# Patient Record
Sex: Female | Born: 1980 | Race: Black or African American | Hispanic: No | Marital: Single | Smoking: Current every day smoker
Health system: Southern US, Community
[De-identification: ages and names within clinical notes are randomized; demographics above are authoritative.]

## PROBLEM LIST (undated history)

## (undated) DIAGNOSIS — D219 Benign neoplasm of connective and other soft tissue, unspecified: Secondary | ICD-10-CM

## (undated) DIAGNOSIS — D649 Anemia, unspecified: Secondary | ICD-10-CM

## (undated) HISTORY — DX: Anemia, unspecified: D64.9

## (undated) HISTORY — DX: Benign neoplasm of connective and other soft tissue, unspecified: D21.9

## (undated) HISTORY — PX: TUBAL LIGATION: SHX77

## (undated) HISTORY — PX: GALLBLADDER SURGERY: SHX652

## (undated) SURGERY — HYSTERECTOMY, SUPRACERVICAL, ABDOMINAL
Anesthesia: General

---

## 2002-07-26 ENCOUNTER — Encounter: Payer: Self-pay | Admitting: Emergency Medicine

## 2002-07-26 ENCOUNTER — Emergency Department (HOSPITAL_COMMUNITY): Admission: EM | Admit: 2002-07-26 | Discharge: 2002-07-26 | Payer: Self-pay | Admitting: Emergency Medicine

## 2012-10-11 LAB — OB RESULTS CONSOLE HGB/HCT, BLOOD
HCT: 27 %
Hemoglobin: 8.3 g/dL

## 2012-10-11 LAB — OB RESULTS CONSOLE PLATELET COUNT: Platelets: 350 10*3/uL

## 2012-10-16 ENCOUNTER — Other Ambulatory Visit (HOSPITAL_COMMUNITY): Payer: Self-pay | Admitting: *Deleted

## 2012-10-16 DIAGNOSIS — N926 Irregular menstruation, unspecified: Secondary | ICD-10-CM

## 2012-10-19 ENCOUNTER — Ambulatory Visit (HOSPITAL_COMMUNITY): Payer: Medicaid Other

## 2012-10-22 ENCOUNTER — Ambulatory Visit (HOSPITAL_COMMUNITY)
Admission: RE | Admit: 2012-10-22 | Discharge: 2012-10-22 | Disposition: A | Discharge status: Home / Self Care | Payer: Medicaid Other | Source: Ambulatory Visit | Attending: *Deleted | Admitting: *Deleted

## 2012-10-22 ENCOUNTER — Other Ambulatory Visit (HOSPITAL_COMMUNITY): Payer: Medicaid Other

## 2012-10-22 DIAGNOSIS — R9389 Abnormal findings on diagnostic imaging of other specified body structures: Secondary | ICD-10-CM | POA: Insufficient documentation

## 2012-10-22 DIAGNOSIS — N949 Unspecified condition associated with female genital organs and menstrual cycle: Secondary | ICD-10-CM | POA: Insufficient documentation

## 2012-10-22 DIAGNOSIS — N926 Irregular menstruation, unspecified: Secondary | ICD-10-CM | POA: Insufficient documentation

## 2012-11-06 ENCOUNTER — Other Ambulatory Visit (HOSPITAL_COMMUNITY): Payer: Self-pay | Admitting: *Deleted

## 2012-11-06 DIAGNOSIS — D219 Benign neoplasm of connective and other soft tissue, unspecified: Secondary | ICD-10-CM

## 2012-11-16 ENCOUNTER — Ambulatory Visit (HOSPITAL_COMMUNITY)
Admission: RE | Admit: 2012-11-16 | Discharge: 2012-11-16 | Disposition: A | Discharge status: Home / Self Care | Payer: Medicaid Other | Source: Ambulatory Visit | Attending: *Deleted | Admitting: *Deleted

## 2012-11-16 DIAGNOSIS — D219 Benign neoplasm of connective and other soft tissue, unspecified: Secondary | ICD-10-CM

## 2012-11-16 DIAGNOSIS — N83209 Unspecified ovarian cyst, unspecified side: Secondary | ICD-10-CM | POA: Insufficient documentation

## 2012-11-16 DIAGNOSIS — D250 Submucous leiomyoma of uterus: Secondary | ICD-10-CM | POA: Insufficient documentation

## 2012-11-28 ENCOUNTER — Encounter: Payer: Self-pay | Admitting: *Deleted

## 2012-11-28 ENCOUNTER — Ambulatory Visit (INDEPENDENT_AMBULATORY_CARE_PROVIDER_SITE_OTHER): Payer: Medicaid Other | Admitting: Obstetrics and Gynecology

## 2012-11-28 ENCOUNTER — Encounter: Payer: Self-pay | Admitting: Obstetrics and Gynecology

## 2012-11-28 VITALS — BP 100/68 | Wt 149.2 lb

## 2012-11-28 DIAGNOSIS — N920 Excessive and frequent menstruation with regular cycle: Secondary | ICD-10-CM

## 2012-11-28 DIAGNOSIS — R809 Proteinuria, unspecified: Secondary | ICD-10-CM

## 2012-11-28 DIAGNOSIS — N946 Dysmenorrhea, unspecified: Secondary | ICD-10-CM

## 2012-11-28 DIAGNOSIS — R319 Hematuria, unspecified: Secondary | ICD-10-CM

## 2012-11-28 DIAGNOSIS — D250 Submucous leiomyoma of uterus: Secondary | ICD-10-CM

## 2012-11-28 DIAGNOSIS — D649 Anemia, unspecified: Secondary | ICD-10-CM

## 2012-11-28 DIAGNOSIS — R350 Frequency of micturition: Secondary | ICD-10-CM

## 2012-11-28 LAB — POCT URINALYSIS DIPSTICK
Blood, UA: 3
Glucose, UA: NEGATIVE
Nitrite, UA: NEGATIVE

## 2012-11-28 MED ORDER — MEGESTROL ACETATE 40 MG PO TABS: 40.0000 mg | ORAL_TABLET | ORAL | Status: DC

## 2012-11-28 NOTE — Patient Instructions (Signed)
Fibroids Fibroids are lumps (tumors) that can occur any place in a woman's body. These lumps are not cancerous. Fibroids vary in size, weight, and where they grow. HOME CARE  Do not take aspirin.  Write down the number of pads or tampons you use during your period. Tell your doctor. This can help determine the best treatment for you. GET HELP RIGHT AWAY IF:  You have pain in your lower belly (abdomen) that is not helped with medicine.  You have cramps that are not helped with medicine.  You have more bleeding between or during your period.  You feel lightheaded or pass out (faint).  Your lower belly pain gets worse. MAKE SURE YOU:  Understand these instructions.  Will watch your condition.  Will get help right away if you are not doing well or get worse. Document Released: 09/03/2010 Document Revised: 10/24/2011 Document Reviewed: 09/03/2010 [facility] Patient Information 2013 [place], [place]. You will receive a prescription for medication, Megace, which will be called to the pharmacy in your records, [facility], and  will take this medication 3 times daily until bleeding stops, then once daily until her followup appointment. If bleeding remains uncontrolled please call us so we can reschedule at an earlier date. Continue your iron and vitamins twice a day.

## 2012-11-28 NOTE — Progress Notes (Signed)
  Subjective:    Tara Moon is a 32 y.o. female who presents with uterine fibroids. Periods are and quite heavy resulting in anemia, lasting several days. Dysmenorrhea:severe, occurring throughout menses. Cyclic symptoms include none. No intermenstrual bleeding, spotting, or discharge. Tara Moon  is referred from the health department. Ultrasound has shown her to have a submucous pedunculated fibroid. Sonohysterogram shows that the fibroid is primarily inside the endometrial cavity and would be treatable by hysteroscopic resection, or supracervical hysterectomy. After discussion, the patient desires to proceed with supracervical hysterectomy.  Current contraception: tubal ligation History of abnormal Pap smear: no Family history of uterine or ovarian cancer: no Regular self breast exam: not applicable History of abnormal mammogram: no Family history of breast cancer: no History of abnormal lipids: no Menstrual History: OB History   Grav Para Term Preterm Abortions TAB SAB Ect Mult Living   6 4 4  2  1   4         The following portions of the patient's history were reviewed and updated as appropriate: past family history, past social history and problem list.  Review of Systems Pertinent items are noted in HPI.   Objective:     BP 100/68  Wt 149 lb 3.2 oz (67.677 kg)  LMP 11/27/2012 General appearance: alert, no distress and pale Head: Normocephalic, without obvious abnormality Abdomen: soft, non-tender; bowel sounds normal; no masses,  no organomegaly Pelvic: cervix normal in appearance, external genitalia normal, no adnexal masses or tenderness, no bladder tenderness, no cervical motion tenderness, rectovaginal septum normal, vagina normal without discharge and Uterus well supported, upper limits normal size, deep in pelvis Extremities: extremities normal, atraumatic, no cyanosis or edema and Homans sign is negative, no sign of DVT Pulses: 2+ and symmetric   Assessment:    Symptomatic uterine fibroids.   Plan:   All questions answered. After discussion of treatment options, we will proceed toward laparoscopic supracervical hysterectomy. The patient will take Megace 3 times daily until the bleeding stops, then once daily until followup in 3 weeks. Records from health department to be reviewed. Preop appointment 3 weeks to recheck hemoglobin and finalize surgical plans

## 2012-12-17 ENCOUNTER — Encounter (HOSPITAL_COMMUNITY): Payer: Self-pay | Admitting: Pharmacist

## 2012-12-19 ENCOUNTER — Encounter: Payer: Self-pay | Admitting: Obstetrics and Gynecology

## 2012-12-19 ENCOUNTER — Ambulatory Visit (INDEPENDENT_AMBULATORY_CARE_PROVIDER_SITE_OTHER): Payer: Medicaid Other | Admitting: Obstetrics and Gynecology

## 2012-12-19 VITALS — BP 100/60 | Ht 67.0 in | Wt 148.8 lb

## 2012-12-19 DIAGNOSIS — D259 Leiomyoma of uterus, unspecified: Secondary | ICD-10-CM

## 2012-12-19 DIAGNOSIS — D250 Submucous leiomyoma of uterus: Secondary | ICD-10-CM

## 2012-12-19 DIAGNOSIS — D500 Iron deficiency anemia secondary to blood loss (chronic): Secondary | ICD-10-CM

## 2012-12-19 DIAGNOSIS — D649 Anemia, unspecified: Secondary | ICD-10-CM

## 2012-12-19 DIAGNOSIS — N920 Excessive and frequent menstruation with regular cycle: Secondary | ICD-10-CM

## 2012-12-19 LAB — POCT HEMOGLOBIN: Hemoglobin: 8.5 g/dL — AB (ref 12.2–16.2)

## 2012-12-19 MED ORDER — LEUPROLIDE ACETATE 3.75 MG IM KIT: 3.7500 mg | PACK | Freq: Once | INTRAMUSCULAR | Status: DC

## 2012-12-19 NOTE — Patient Instructions (Addendum)
Tara Moon  12/19/2012   Your procedure is scheduled on:   12/25/2012  Report to [facility] at  945  AM.  Call this number if you have problems the morning of surgery: [phone]   Remember:   Do not eat food or drink liquids after midnight.   Take these medicines the morning of surgery with A SIP OF WATER:  norco   Do not wear jewelry, make-up or nail polish.  Do not wear lotions, powders, or perfumes.  Do not shave 48 hours prior to surgery. Men may shave face and neck.  Do not bring valuables to the hospital.  Contacts, dentures or bridgework may not be worn into surgery.  Leave suitcase in the car. After surgery it may be brought to your room.  For patients admitted to the hospital, checkout time is 11:00 AM the day of discharge.   Patients discharged the day of surgery will not be allowed to drive  home.  Name and phone number of your driver: family  Special Instructions: Shower using CHG 2 nights before surgery and the night before surgery.  If you shower the day of surgery use CHG.  Use special wash - you have one bottle of CHG for all showers.  You should use approximately 1/3 of the bottle for each shower.   Please read over the following fact sheets that you were given: Pain Booklet, Coughing and Deep Breathing, Blood Transfusion Information, MRSA Information, Surgical Site Infection Prevention, Anesthesia Post-op Instructions and Care and Recovery After Surgery Supracervical Hysterectomy A supracervical hysterectomy is minimally invasive surgery to remove the top part of the uterus, but not the cervix. This surgery can be performed by making a large cut (incision) in the abdomen. It can also be done with a thin, lighted tube (laparoscope) inserted into 2 small incisions in the lower abdomen. Your fallopian tubes and ovaries can be removed (bilateral salpingo-oopherectomy) during this surgery as well. If a supracervical hysterectomy is started and it is not safe to continue,  the laparoscopic surgery will be converted to an open abdominal surgery. You will not have menstrual periods or be able to get pregnant after having this surgery. If a bilateral salpingo-oopherectomy was performed before menopause, you will go through a sudden (abrupt) menopause. This can be helped with hormone medicines. Benefits of minimally invasive surgery include:  Less pain.  Less risk of blood loss.  Less risk of infection.  Quicker return to normal activities.  Usually a 1 night stay in the hospital.  Overall patient satisfaction. LET YOUR CAREGIVER KNOW ABOUT:  Any history of abnormal Pap tests.  Allergies to food or medicine.  Medicines taken, including vitamins, herbs, eyedrops, over-the-counter medicines, and creams.  Use of steroids (by mouth or creams).  Previous problems with anesthetics or numbing medicines.  History of bleeding problems or blood clots.  Previous surgery.  Other health problems, including diabetes and kidney problems.  Any infections or colds you may have developed.  Symptoms of irregular or heavy periods, weight loss, or urinary or bowel changes. RISKS AND COMPLICATIONS   Bleeding.  Blood clots in the legs or lung.  Infection.  Injury to surrounding organs.  Problems with anesthesia.  Risk of conversion to an open abdominal incision.  Early menopause symptoms (hot flashes, night sweats, insomnia).  Additional surgery later to remove the cervix if you have problems with the cervix. BEFORE THE PROCEDURE  Ask your caregiver about changing or stopping your regular medicines.  Do not take aspirin or blood thinners (anticoagulants) for 1 week before the surgery, or as told by your caregiver.  Do not eat or drink anything for 8 hours before the surgery, or as told by your caregiver.  Quit smoking if you smoke.  Arrange for a ride home after surgery and for someone to help you at home during recovery. PROCEDURE   You will be  given an antibiotic medicine.  An intravenous (IV) line will be placed in your arm. You will be given medicine to make you sleep (general anesthetic).  A gas (carbon dioxide) will be used to inflate your abdomen. This will allow your surgeon to look inside your abdomen, perform your surgery, and treat any other problems found if necessary.  Three or four small incisions (often less than  inch) will be made in your abdomen. One of these incisions will be made in the area of your belly button (navel). The laparoscope will be inserted into the incision. Your surgeon will look through the laparoscope while doing your procedure.  Other surgical instruments will be inserted through the other incisions.  The uterus will be cut into small pieces and removed through the small incisions.  Your incisions will be closed. AFTER THE PROCEDURE   The gas will be released from inside your abdomen.  You will be taken to the recovery area where a nurse will watch and check your progress. Once you are awake, stable, and taking fluids well, without other problems, you will return to your room or be allowed to go home.  There is usually minimal discomfort following the surgery because the incisions are so small.  You will be given pain medicine while you are in the hospital and for when you go home.  Try to have someone with you for the first 3 to 5 days after you go home.  Follow up with your surgeon in 2 to 4 weeks after surgery to evaluate your progress. Document Released: 01/18/2008 Document Revised: 10/24/2011 Document Reviewed: 03/18/2011 [facility] Patient Information 2013 [place], [place]. PATIENT INSTRUCTIONS POST-ANESTHESIA  IMMEDIATELY FOLLOWING SURGERY:  Do not drive or operate machinery for the first twenty four hours after surgery.  Do not make any important decisions for twenty four hours after surgery or while taking narcotic pain medications or sedatives.  If you develop intractable nausea  and vomiting or a severe headache please notify your doctor immediately.  FOLLOW-UP:  Please make an appointment with your surgeon as instructed. You do not need to follow up with anesthesia unless specifically instructed to do so.  WOUND CARE INSTRUCTIONS (if applicable):  Keep a dry clean dressing on the anesthesia/puncture wound site if there is drainage.  Once the wound has quit draining you may leave it open to air.  Generally you should leave the bandage intact for twenty four hours unless there is drainage.  If the epidural site drains for more than 36-48 hours please call the anesthesia department.  QUESTIONS?:  Please feel free to call your physician or the hospital operator if you have any questions, and they will be happy to assist you.

## 2012-12-19 NOTE — Patient Instructions (Addendum)
Return tomorrow with lupron for injection to suppress the endometrium and the periods. We will reschedule the surgery for 4-6 weeks.

## 2012-12-19 NOTE — Progress Notes (Signed)
Patient has still low hgb, had 2 heavy menses this month. Options reviewed; will suppress with lupron 3.75 mg IM, and postpone surgery 6 wks.  Will rx today, get shot tomorrow . Reschedule surgery x 4-6 wks

## 2012-12-20 ENCOUNTER — Encounter (HOSPITAL_COMMUNITY)
Admission: RE | Admit: 2012-12-20 | Discharge: 2012-12-20 | Disposition: A | Discharge status: Home / Self Care | Payer: Medicaid Other | Source: Ambulatory Visit | Attending: *Deleted | Admitting: *Deleted

## 2012-12-20 ENCOUNTER — Ambulatory Visit: Payer: Medicaid Other

## 2012-12-25 ENCOUNTER — Encounter (HOSPITAL_COMMUNITY): Admission: RE | Payer: Self-pay | Source: Ambulatory Visit

## 2012-12-25 ENCOUNTER — Ambulatory Visit (HOSPITAL_COMMUNITY)
Admission: RE | Admit: 2012-12-25 | Payer: Medicaid Other | Source: Ambulatory Visit | Admitting: Obstetrics and Gynecology

## 2012-12-25 SURGERY — HYSTERECTOMY, SUPRACERVICAL, LAPAROSCOPIC
Anesthesia: General

## 2013-01-08 ENCOUNTER — Encounter: Payer: Medicaid Other | Admitting: Obstetrics and Gynecology

## 2013-01-16 ENCOUNTER — Ambulatory Visit: Payer: Medicaid Other | Admitting: Obstetrics and Gynecology

## 2013-03-11 ENCOUNTER — Other Ambulatory Visit: Payer: Self-pay | Admitting: Obstetrics and Gynecology

## 2013-03-11 ENCOUNTER — Ambulatory Visit (INDEPENDENT_AMBULATORY_CARE_PROVIDER_SITE_OTHER): Payer: Medicaid Other | Admitting: Obstetrics and Gynecology

## 2013-03-11 ENCOUNTER — Encounter: Payer: Self-pay | Admitting: Obstetrics and Gynecology

## 2013-03-11 VITALS — BP 108/74 | Ht 65.0 in | Wt 150.4 lb

## 2013-03-11 DIAGNOSIS — N949 Unspecified condition associated with female genital organs and menstrual cycle: Secondary | ICD-10-CM

## 2013-03-11 DIAGNOSIS — N938 Other specified abnormal uterine and vaginal bleeding: Secondary | ICD-10-CM

## 2013-03-11 DIAGNOSIS — D649 Anemia, unspecified: Secondary | ICD-10-CM

## 2013-03-11 MED ORDER — FERUMOXYTOL INJECTION 510 MG/17 ML: 510.0000 mg | Freq: Once | INTRAVENOUS | Status: DC

## 2013-03-11 MED ORDER — MEGESTROL ACETATE 40 MG PO TABS: 40.0000 mg | ORAL_TABLET | Freq: Three times a day (TID) | ORAL | Status: DC

## 2013-03-11 NOTE — Patient Instructions (Addendum)
Expect to call regarding the IV iron infusion at [facility] within the week if you not heard from that os by Friday please call our office to medication will be infused at 1 PM on Wednesday of this week please take this paper with you to the office on Wednesday

## 2013-03-11 NOTE — Addendum Note (Signed)
Addended by: Tilda Burrow on: 03/11/2013 02:54 PM   Modules accepted: Level of Service

## 2013-03-11 NOTE — Progress Notes (Signed)
   [facility] Visit  Patient name: Tara Moon MRN [id number]  Date of birth: 02-05-1981  CC & HPI:  Tara Moon is a 32 y.o. female presenting today for for followup of her previous visits in May where we were planning surgery for submucous fibroid. Patient desires supracervical hysterectomy. She's had a normal Pap smear at the health department earlier this year hemoglobin recheck today 7.5. Patient has been bleeding quite heavily last 2 weeks since he ran out of Megace  ROS:  Heavy clots huge clots  Pertinent History Reviewed:  Medical & Surgical Hx:  Reviewed: Significant for sonohysterogram showing intramural and submucosal fibroid Medications: Reviewed & Updated - see associated section Social History: Reviewed -  reports that she has been smoking Cigarettes.  She has a 1.5 pack-year smoking history. She has never used smokeless tobacco.  Objective Findings:  Vitals: BP 108/74  Ht 5\' 5"  (1.651 m)  Wt 68.221 kg (150 lb 6.4 oz)  BMI 25.03 kg/m2  LMP 03/02/2013  Physical Examination: General appearance - alert, well appearing, and in no distress, oriented to person, place, and time and mildly anemic, slight pale, cold intolerance Mental status - alert, oriented to person, place, and time, normal mood, behavior, speech, dress, motor activity, and thought processes   Assessment & Plan:   Recurrent dysfunctional bleeding due to submucous or pedunculated fibroid   Recurrent anemia will restart Megace, schedule IV in infusion at [facility] through outpatient appointments, and hoped to do surgery in approximately 4 weeks

## 2013-03-12 ENCOUNTER — Other Ambulatory Visit: Payer: Self-pay | Admitting: Obstetrics and Gynecology

## 2013-03-13 ENCOUNTER — Encounter (HOSPITAL_COMMUNITY)
Admission: RE | Admit: 2013-03-13 | Discharge: 2013-03-13 | Disposition: A | Discharge status: Home / Self Care | Payer: Medicaid Other | Source: Ambulatory Visit | Attending: Obstetrics and Gynecology | Admitting: Obstetrics and Gynecology

## 2013-03-13 DIAGNOSIS — D649 Anemia, unspecified: Secondary | ICD-10-CM | POA: Insufficient documentation

## 2013-03-13 MED ORDER — FERUMOXYTOL INJECTION 510 MG/17 ML
510.0000 mg | Freq: Once | INTRAVENOUS | Status: AC
  Administered 2013-03-13: 510 mg via INTRAVENOUS
  Filled 2013-03-13: qty 17

## 2013-03-13 MED ORDER — DIPHENHYDRAMINE HCL 50 MG/ML IJ SOLN
INTRAMUSCULAR | Status: AC
  Filled 2013-03-13: qty 1

## 2013-03-13 MED ORDER — DIPHENHYDRAMINE HCL 50 MG/ML IJ SOLN
25.0000 mg | Freq: Once | INTRAMUSCULAR | Status: AC
  Administered 2013-03-13: 25 mg via INTRAVENOUS

## 2013-03-13 MED ORDER — SODIUM CHLORIDE 0.9 % IV SOLN
Freq: Once | INTRAVENOUS | Status: AC
  Administered 2013-03-13: 14:00:00 via INTRAVENOUS

## 2013-03-13 NOTE — Progress Notes (Signed)
Tolerated diluted infusion well. No S/S kept 30 mins post infusion. VSS stable.

## 2013-03-13 NOTE — Progress Notes (Signed)
Dr. Emelda Fear in, Feraheme diluted in 50ml NS infused via guardrail IV. Benadryl administered previously.

## 2013-04-08 ENCOUNTER — Ambulatory Visit (INDEPENDENT_AMBULATORY_CARE_PROVIDER_SITE_OTHER): Payer: Medicaid Other | Admitting: Obstetrics and Gynecology

## 2013-04-08 ENCOUNTER — Encounter: Payer: Self-pay | Admitting: Obstetrics and Gynecology

## 2013-04-08 VITALS — BP 110/78 | Ht 65.0 in | Wt 150.8 lb

## 2013-04-08 DIAGNOSIS — Z01818 Encounter for other preprocedural examination: Secondary | ICD-10-CM

## 2013-04-08 DIAGNOSIS — D649 Anemia, unspecified: Secondary | ICD-10-CM

## 2013-04-08 LAB — POCT HEMOGLOBIN: Hemoglobin: 11.3 g/dL — AB (ref 12.2–16.2)

## 2013-04-08 NOTE — Progress Notes (Signed)
   [facility] Visit  Patient name: Tara Moon MRN [id number]  Date of birth: October 10, 1980  CC & HPI:  Tara Moon is a 32 y.o. female presenting today for rescheduling of Supracervical Hysterectomy  ROS:  Patient has had menses controlled adequately on Megace, and last menses was less severe  Pertinent History Reviewed:  Medical & Surgical Hx:  Reviewed: Significant for known submucosal fibroid Medications: Reviewed & Updated - see associated section Social History: Reviewed -  reports that she has been smoking Cigarettes.  She has a 1.5 pack-year smoking history. She has never used smokeless tobacco.  Objective Findings:  Vitals: BP 110/78  Ht 5\' 5"  (1.651 m)  Wt 150 lb 12.8 oz (68.402 kg)  BMI 25.09 kg/m2  LMP 04/04/2013  Physical Examination: General appearance - alert, well appearing, and in no distress, oriented to person, place, and time, normal appearing weight and well hydrated Mental status - alert, oriented to person, place, and time, normal mood, behavior, speech, dress, motor activity, and thought processes Neck - supple, no significant adenopathy Chest - clear to auscultation, no wheezes, rales or rhonchi, symmetric air entry Abdomen - soft, nontender, nondistended, no masses or organomegaly Pelvic - VULVA: normal appearing vulva with no masses, tenderness or lesions, VAGINA: normal appearing vagina with normal color and discharge, no lesions, CERVIX: normal appearing cervix without discharge or lesions, DNA probe for chlamydia and GC obtained, UTERUS: enlarged to 8 week's size, ADNEXA: normal adnexa in size, nontender and no masses Musculoskeletal - no muscular tenderness noted Extremities - peripheral pulses normal, no pedal edema, no clubbing or cyanosis   Assessment & Plan:   Submucosal fibroid, patient declining hysteroscopic effort at excision, desiring supracervical hysterectomy Plan: supracervical hysterectomy, open procedure to excise

## 2013-04-08 NOTE — Patient Instructions (Signed)
See Dawn to get info on preop appt time and date We will perform supracervical hysterectomy and remove both tubes. We will preserve the ovaries, and the cervix.

## 2013-04-09 LAB — GC/CHLAMYDIA PROBE AMP: GC Probe RNA: NEGATIVE

## 2013-04-13 ENCOUNTER — Telehealth: Payer: Self-pay | Admitting: Obstetrics and Gynecology

## 2013-04-13 NOTE — Progress Notes (Signed)
Note completed in progress note

## 2013-04-17 ENCOUNTER — Other Ambulatory Visit: Payer: Self-pay | Admitting: Obstetrics and Gynecology

## 2013-04-17 NOTE — H&P (Signed)
Tara Moon is an 32 y.o. female. 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 She has had discussion of treatment options, and desires hysterectomy, planned as a supracervical hysterectomy.   Pertinent Gynecological History: Menses: flow is excessive with use of numerous pads or tampons on heaviest days Bleeding: dysfunctional uterine bleeding Contraception: none DES exposure: unknown Blood transfusions: none Sexually transmitted diseases: no past history Previous GYN Procedures:   Last mammogram:  Date:  Last pap: normal Date:  OB History: G6, P4024   Menstrual History: Menarche age:  Patient's last menstrual period was 04/04/2013.    Past Medical History  Diagnosis Date  . Anemia   . Fibroids     Past Surgical History  Procedure Laterality Date  . Tubal ligation    . Gallbladder surgery      No family history on file.  Social History:  reports that she has been smoking Cigarettes.  She has a 1.5 pack-year smoking history. She has never used smokeless tobacco. She reports that  drinks alcohol. She reports that she uses illicit drugs (Marijuana).  Allergies:  Allergies  Allergen Reactions  . Aspirin Rash     (Not in a hospital admission)  ROS  Last menstrual period 04/04/2013. Physical Exam Physical Examination: General appearance - alert, well appearing, and in no distress, oriented to person, place, and time and normal appearing weight Mental status - alert, oriented to person, place, and time Neck - supple, no significant adenopathy Heart - normal rate and regular rhythm Abdomen - soft, nontender, nondistended, no masses or organomegaly Pelvic - normal external genitalia, vulva, vagina, cervix, uterus and adnexa, UTERUS: uterus is normal size, shape, consistency and nontender, enlarged to 8 week's size See u/s report for sonohysterogram No results found for this or any previous visit (from the past 24  hour(s)).  No results found.  Assessment/Plan: Submucus fibroid, and resultant anemia, corrected., admitted for supracervical hysterectomy  Tamisha Nordstrom V 04/17/2013, 6:03 PM  

## 2013-04-22 ENCOUNTER — Encounter (HOSPITAL_COMMUNITY): Payer: Self-pay | Admitting: Pharmacy Technician

## 2013-04-25 ENCOUNTER — Encounter (HOSPITAL_COMMUNITY): Payer: Self-pay

## 2013-04-25 ENCOUNTER — Encounter (HOSPITAL_COMMUNITY)
Admission: RE | Admit: 2013-04-25 | Discharge: 2013-04-25 | Disposition: A | Discharge status: Home / Self Care | Payer: Medicaid Other | Source: Ambulatory Visit | Attending: Obstetrics and Gynecology | Admitting: Obstetrics and Gynecology

## 2013-04-25 DIAGNOSIS — Z01818 Encounter for other preprocedural examination: Secondary | ICD-10-CM | POA: Insufficient documentation

## 2013-04-25 DIAGNOSIS — Z01812 Encounter for preprocedural laboratory examination: Secondary | ICD-10-CM | POA: Insufficient documentation

## 2013-04-25 LAB — URINALYSIS, ROUTINE W REFLEX MICROSCOPIC
Protein, ur: NEGATIVE mg/dL
Urobilinogen, UA: 0.2 mg/dL (ref 0.0–1.0)

## 2013-04-25 LAB — BASIC METABOLIC PANEL
Chloride: 108 mEq/L (ref 96–112)
GFR calc Af Amer: >90 mL/min (ref >90)
Potassium: 3.8 mEq/L (ref 3.5–5.1)

## 2013-04-25 LAB — URINE MICROSCOPIC-ADD ON

## 2013-04-25 LAB — CBC
HCT: 35.2 % — ABNORMAL LOW (ref 36.0–46.0)
Hemoglobin: 11.5 g/dL — ABNORMAL LOW (ref 12.0–15.0)
WBC: 7.4 10*3/uL (ref 4.0–10.5)

## 2013-04-25 NOTE — Patient Instructions (Addendum)
Your procedure is scheduled on: 04/30/2013  Report to [facility] at  6:15   AM.  Call this number if you have problems the morning of surgery: [phone]   Remember:   Do not drink or eat food:After Midnight.  :  Take these medicines the morning of surgery with A SIP OF WATER:    Do not wear jewelry, make-up or nail polish.  Do not wear lotions, powders, or perfumes. You may wear deodorant.  Do not shave 48 hours prior to surgery. Men may shave face and neck.  Do not bring valuables to the hospital.  Contacts, dentures or bridgework may not be worn into surgery.  Leave suitcase in the car. After surgery it may be brought to your room.  For patients admitted to the hospital, checkout time is 11:00 AM the day of discharge.   Patients discharged the day of surgery will not be allowed to drive home.    Special Instructions: Shower using CHG 2 nights before surgery and the night before surgery.  If you shower the day of surgery use CHG.  Use special wash - you have one bottle of CHG for all showers.  You should use approximately 1/3 of the bottle for each shower.   Please read over the following fact sheets that you were given: Pain Booklet, MRSA Information, Surgical Site Infection Prevention and Care and Recovery After Surgery  Supracervical Hysterectomy A supracervical hysterectomy is minimally invasive surgery to remove the top part of the uterus, but not the cervix. This surgery can be performed by making a large cut (incision) in the abdomen. It can also be done with a thin, lighted tube (laparoscope) inserted into 2 small incisions in the lower abdomen. Your fallopian tubes and ovaries can be removed (bilateral salpingo-oopherectomy) during this surgery as well. If a supracervical hysterectomy is started and it is not safe to continue, the laparoscopic surgery will be converted to an open abdominal surgery. You will not have menstrual periods or be able to get pregnant after having this  surgery. If a bilateral salpingo-oopherectomy was performed before menopause, you will go through a sudden (abrupt) menopause. This can be helped with hormone medicines. Benefits of minimally invasive surgery include:  Less pain.  Less risk of blood loss.  Less risk of infection.  Quicker return to normal activities.  Usually a 1 night stay in the hospital.  Overall patient satisfaction. LET YOUR CAREGIVER KNOW ABOUT:  Any history of abnormal Pap tests.  Allergies to food or medicine.  Medicines taken, including vitamins, herbs, eyedrops, over-the-counter medicines, and creams.  Use of steroids (by mouth or creams).  Previous problems with anesthetics or numbing medicines.  History of bleeding problems or blood clots.  Previous surgery.  Other health problems, including diabetes and kidney problems.  Any infections or colds you may have developed.  Symptoms of irregular or heavy periods, weight loss, or urinary or bowel changes. RISKS AND COMPLICATIONS   Bleeding.  Blood clots in the legs or lung.  Infection.  Injury to surrounding organs.  Problems with anesthesia.  Risk of conversion to an open abdominal incision.  Early menopause symptoms (hot flashes, night sweats, insomnia).  Additional surgery later to remove the cervix if you have problems with the cervix. BEFORE THE PROCEDURE  Ask your caregiver about changing or stopping your regular medicines.  Do not take aspirin or blood thinners (anticoagulants) for 1 week before the surgery, or as told by your caregiver.  Do not eat  or drink anything for 8 hours before the surgery, or as told by your caregiver.  Quit smoking if you smoke.  Arrange for a ride home after surgery and for someone to help you at home during recovery. PROCEDURE   You will be given an antibiotic medicine.  An intravenous (IV) line will be placed in your arm. You will be given medicine to make you sleep (general  anesthetic).  A gas (carbon dioxide) will be used to inflate your abdomen. This will allow your surgeon to look inside your abdomen, perform your surgery, and treat any other problems found if necessary.  Three or four small incisions (often less than  inch) will be made in your abdomen. One of these incisions will be made in the area of your belly button (navel). The laparoscope will be inserted into the incision. Your surgeon will look through the laparoscope while doing your procedure.  Other surgical instruments will be inserted through the other incisions.  The uterus will be cut into small pieces and removed through the small incisions.  Your incisions will be closed. AFTER THE PROCEDURE   The gas will be released from inside your abdomen.  You will be taken to the recovery area where a nurse will watch and check your progress. Once you are awake, stable, and taking fluids well, without other problems, you will return to your room or be allowed to go home.  There is usually minimal discomfort following the surgery because the incisions are so small.  You will be given pain medicine while you are in the hospital and for when you go home.  Try to have someone with you for the first 3 to 5 days after you go home.  Follow up with your surgeon in 2 to 4 weeks after surgery to evaluate your progress. Document Released: 01/18/2008 Document Revised: 10/24/2011 Document Reviewed: 03/18/2011 [facility] Patient Information 2014 [place], [place]. PATIENT INSTRUCTIONS POST-ANESTHESIA  IMMEDIATELY FOLLOWING SURGERY:  Do not drive or operate machinery for the first twenty four hours after surgery.  Do not make any important decisions for twenty four hours after surgery or while taking narcotic pain medications or sedatives.  If you develop intractable nausea and vomiting or a severe headache please notify your doctor immediately.  FOLLOW-UP:  Please make an appointment with your surgeon as  instructed. You do not need to follow up with anesthesia unless specifically instructed to do so.  WOUND CARE INSTRUCTIONS (if applicable):  Keep a dry clean dressing on the anesthesia/puncture wound site if there is drainage.  Once the wound has quit draining you may leave it open to air.  Generally you should leave the bandage intact for twenty four hours unless there is drainage.  If the epidural site drains for more than 36-48 hours please call the anesthesia department.  QUESTIONS?:  Please feel free to call your physician or the hospital operator if you have any questions, and they will be happy to assist you.

## 2013-04-26 LAB — TYPE AND SCREEN
ABO/RH(D): O POS
Antibody Screen: NEGATIVE

## 2013-04-26 LAB — URINE CULTURE: Colony Count: 40000

## 2013-04-30 ENCOUNTER — Encounter (HOSPITAL_COMMUNITY): Payer: Self-pay | Admitting: *Deleted

## 2013-04-30 ENCOUNTER — Encounter (HOSPITAL_COMMUNITY)
Admission: RE | Disposition: A | Discharge status: Home / Self Care | Payer: Self-pay | Source: Ambulatory Visit | Attending: Obstetrics and Gynecology

## 2013-04-30 ENCOUNTER — Encounter (HOSPITAL_COMMUNITY): Payer: Self-pay | Admitting: Anesthesiology

## 2013-04-30 ENCOUNTER — Inpatient Hospital Stay (HOSPITAL_COMMUNITY): Payer: Medicaid Other | Admitting: Anesthesiology

## 2013-04-30 ENCOUNTER — Observation Stay (HOSPITAL_COMMUNITY)
Admission: RE | Admit: 2013-04-30 | Discharge: 2013-05-01 | DRG: 742 | Disposition: A | Discharge status: Home / Self Care | Payer: Medicaid Other | Source: Ambulatory Visit | Attending: Obstetrics and Gynecology | Admitting: Obstetrics and Gynecology

## 2013-04-30 ENCOUNTER — Other Ambulatory Visit: Payer: Self-pay | Admitting: Obstetrics and Gynecology

## 2013-04-30 DIAGNOSIS — D500 Iron deficiency anemia secondary to blood loss (chronic): Secondary | ICD-10-CM | POA: Diagnosis present

## 2013-04-30 DIAGNOSIS — Z90710 Acquired absence of both cervix and uterus: Secondary | ICD-10-CM

## 2013-04-30 DIAGNOSIS — N72 Inflammatory disease of cervix uteri: Secondary | ICD-10-CM

## 2013-04-30 DIAGNOSIS — D250 Submucous leiomyoma of uterus: Secondary | ICD-10-CM

## 2013-04-30 DIAGNOSIS — N949 Unspecified condition associated with female genital organs and menstrual cycle: Secondary | ICD-10-CM | POA: Diagnosis present

## 2013-04-30 DIAGNOSIS — N838 Other noninflammatory disorders of ovary, fallopian tube and broad ligament: Secondary | ICD-10-CM

## 2013-04-30 DIAGNOSIS — D62 Acute posthemorrhagic anemia: Secondary | ICD-10-CM | POA: Diagnosis present

## 2013-04-30 DIAGNOSIS — N938 Other specified abnormal uterine and vaginal bleeding: Secondary | ICD-10-CM | POA: Diagnosis present

## 2013-04-30 DIAGNOSIS — F17200 Nicotine dependence, unspecified, uncomplicated: Secondary | ICD-10-CM | POA: Diagnosis present

## 2013-04-30 DIAGNOSIS — N840 Polyp of corpus uteri: Secondary | ICD-10-CM

## 2013-04-30 DIAGNOSIS — Z23 Encounter for immunization: Secondary | ICD-10-CM | POA: Insufficient documentation

## 2013-04-30 DIAGNOSIS — D259 Leiomyoma of uterus, unspecified: Principal | ICD-10-CM | POA: Diagnosis present

## 2013-04-30 HISTORY — PX: SUPRACERVICAL ABDOMINAL HYSTERECTOMY: SHX5393

## 2013-04-30 HISTORY — PX: BILATERAL SALPINGECTOMY: SHX5743

## 2013-04-30 SURGERY — HYSTERECTOMY, SUPRACERVICAL, ABDOMINAL
Anesthesia: General | Site: Abdomen | Wound class: Clean Contaminated

## 2013-04-30 MED ORDER — ROCURONIUM BROMIDE 100 MG/10ML IV SOLN
INTRAVENOUS | Status: DC | PRN
  Administered 2013-04-30: 10 mg via INTRAVENOUS
  Administered 2013-04-30: 40 mg via INTRAVENOUS

## 2013-04-30 MED ORDER — CEFAZOLIN SODIUM-DEXTROSE 2-3 GM-% IV SOLR
2.0000 g | INTRAVENOUS | Status: AC
  Administered 2013-04-30: 2 g via INTRAVENOUS

## 2013-04-30 MED ORDER — SODIUM CHLORIDE 0.9 % IJ SOLN
INTRAMUSCULAR | Status: DC | PRN
  Administered 2013-04-30: 40 mL

## 2013-04-30 MED ORDER — KETOROLAC TROMETHAMINE 30 MG/ML IJ SOLN
30.0000 mg | Freq: Four times a day (QID) | INTRAMUSCULAR | Status: DC
  Filled 2013-04-30: qty 1

## 2013-04-30 MED ORDER — KCL IN DEXTROSE-NACL 20-5-0.45 MEQ/L-%-% IV SOLN
INTRAVENOUS | Status: DC
  Administered 2013-04-30 – 2013-05-01 (×2): via INTRAVENOUS

## 2013-04-30 MED ORDER — DEXAMETHASONE SODIUM PHOSPHATE 4 MG/ML IJ SOLN
INTRAMUSCULAR | Status: AC
  Filled 2013-04-30: qty 1

## 2013-04-30 MED ORDER — ONDANSETRON HCL 4 MG/2ML IJ SOLN
4.0000 mg | Freq: Once | INTRAMUSCULAR | Status: AC
  Administered 2013-04-30: 4 mg via INTRAVENOUS

## 2013-04-30 MED ORDER — SIMETHICONE 80 MG PO CHEW: 80.0000 mg | CHEWABLE_TABLET | Freq: Four times a day (QID) | ORAL | Status: DC | PRN

## 2013-04-30 MED ORDER — FENTANYL CITRATE 0.05 MG/ML IJ SOLN
25.0000 ug | INTRAMUSCULAR | Status: DC | PRN
  Administered 2013-04-30 (×2): 50 ug via INTRAVENOUS
  Administered 2013-04-30 (×2): 25 ug via INTRAVENOUS
  Administered 2013-04-30: 50 ug via INTRAVENOUS

## 2013-04-30 MED ORDER — 0.9 % SODIUM CHLORIDE (POUR BTL) OPTIME
TOPICAL | Status: DC | PRN
  Administered 2013-04-30: 2000 mL

## 2013-04-30 MED ORDER — FENTANYL CITRATE 0.05 MG/ML IJ SOLN
INTRAMUSCULAR | Status: DC | PRN
  Administered 2013-04-30: 150 ug via INTRAVENOUS
  Administered 2013-04-30: 50 ug via INTRAVENOUS
  Administered 2013-04-30: 100 ug via INTRAVENOUS

## 2013-04-30 MED ORDER — SODIUM CHLORIDE 0.9 % IJ SOLN: 9.0000 mL | INTRAMUSCULAR | Status: DC | PRN

## 2013-04-30 MED ORDER — LACTATED RINGERS IV SOLN
INTRAVENOUS | Status: DC
  Administered 2013-04-30: 07:00:00 via INTRAVENOUS

## 2013-04-30 MED ORDER — ONDANSETRON HCL 4 MG/2ML IJ SOLN
INTRAMUSCULAR | Status: AC
  Filled 2013-04-30: qty 2

## 2013-04-30 MED ORDER — LIDOCAINE HCL 1 % IJ SOLN
INTRAMUSCULAR | Status: DC | PRN
  Administered 2013-04-30: 50 mg via INTRADERMAL

## 2013-04-30 MED ORDER — ONDANSETRON HCL 4 MG PO TABS: 4.0000 mg | ORAL_TABLET | Freq: Four times a day (QID) | ORAL | Status: DC | PRN

## 2013-04-30 MED ORDER — FENTANYL CITRATE 0.05 MG/ML IJ SOLN
INTRAMUSCULAR | Status: AC
  Filled 2013-04-30: qty 2

## 2013-04-30 MED ORDER — MIDAZOLAM HCL 2 MG/2ML IJ SOLN
1.0000 mg | INTRAMUSCULAR | Status: DC | PRN
  Administered 2013-04-30 (×2): 2 mg via INTRAVENOUS

## 2013-04-30 MED ORDER — KETOROLAC TROMETHAMINE 30 MG/ML IJ SOLN
30.0000 mg | Freq: Four times a day (QID) | INTRAMUSCULAR | Status: DC
  Administered 2013-04-30 – 2013-05-01 (×4): 30 mg via INTRAVENOUS
  Filled 2013-04-30 (×3): qty 1

## 2013-04-30 MED ORDER — DEXAMETHASONE SODIUM PHOSPHATE 4 MG/ML IJ SOLN
4.0000 mg | Freq: Once | INTRAMUSCULAR | Status: AC
  Administered 2013-04-30: 4 mg via INTRAVENOUS

## 2013-04-30 MED ORDER — GLYCOPYRROLATE 0.2 MG/ML IJ SOLN
INTRAMUSCULAR | Status: AC
  Filled 2013-04-30: qty 3

## 2013-04-30 MED ORDER — ACETAMINOPHEN 325 MG PO TABS: 650.0000 mg | ORAL_TABLET | ORAL | Status: DC | PRN

## 2013-04-30 MED ORDER — INFLUENZA VAC SPLIT QUAD 0.5 ML IM SUSP
0.5000 mL | INTRAMUSCULAR | Status: AC
  Administered 2013-05-01: 0.5 mL via INTRAMUSCULAR
  Filled 2013-04-30: qty 0.5

## 2013-04-30 MED ORDER — ONDANSETRON HCL 4 MG/2ML IJ SOLN: 4.0000 mg | Freq: Four times a day (QID) | INTRAMUSCULAR | Status: DC | PRN

## 2013-04-30 MED ORDER — ONDANSETRON HCL 4 MG/2ML IJ SOLN: 4.0000 mg | Freq: Once | INTRAMUSCULAR | Status: DC | PRN

## 2013-04-30 MED ORDER — GLYCOPYRROLATE 0.2 MG/ML IJ SOLN
INTRAMUSCULAR | Status: DC | PRN
  Administered 2013-04-30: .6 mg via INTRAVENOUS

## 2013-04-30 MED ORDER — KETOROLAC TROMETHAMINE 30 MG/ML IJ SOLN
INTRAMUSCULAR | Status: AC
  Filled 2013-04-30: qty 1

## 2013-04-30 MED ORDER — OXYCODONE-ACETAMINOPHEN 5-325 MG PO TABS
1.0000 | ORAL_TABLET | ORAL | Status: DC | PRN
Start: 2013-04-30 — End: 2013-05-01
  Administered 2013-04-30: 1 via ORAL
  Administered 2013-05-01: 2 via ORAL
  Filled 2013-04-30: qty 2
  Filled 2013-04-30: qty 1

## 2013-04-30 MED ORDER — LACTATED RINGERS IV SOLN
INTRAVENOUS | Status: DC | PRN
  Administered 2013-04-30 (×2): via INTRAVENOUS

## 2013-04-30 MED ORDER — DIPHENHYDRAMINE HCL 50 MG/ML IJ SOLN: 12.5000 mg | Freq: Four times a day (QID) | INTRAMUSCULAR | Status: DC | PRN

## 2013-04-30 MED ORDER — NEOSTIGMINE METHYLSULFATE 1 MG/ML IJ SOLN
INTRAMUSCULAR | Status: AC
  Filled 2013-04-30: qty 1

## 2013-04-30 MED ORDER — BUPIVACAINE LIPOSOME 1.3 % IJ SUSP
20.0000 mL | Freq: Once | INTRAMUSCULAR | Status: AC
  Administered 2013-04-30: 20 mL
  Filled 2013-04-30: qty 20

## 2013-04-30 MED ORDER — LIDOCAINE HCL (PF) 1 % IJ SOLN
INTRAMUSCULAR | Status: AC
  Filled 2013-04-30: qty 5

## 2013-04-30 MED ORDER — PROPOFOL 10 MG/ML IV EMUL
INTRAVENOUS | Status: AC
  Filled 2013-04-30: qty 20

## 2013-04-30 MED ORDER — KETOROLAC TROMETHAMINE 30 MG/ML IJ SOLN
30.0000 mg | Freq: Once | INTRAMUSCULAR | Status: AC
  Administered 2013-04-30: 30 mg via INTRAVENOUS

## 2013-04-30 MED ORDER — HYDROMORPHONE 0.3 MG/ML IV SOLN
INTRAVENOUS | Status: DC
Start: 2013-04-30 — End: 2013-04-30
  Administered 2013-04-30: 1.6 mg via INTRAVENOUS
  Administered 2013-04-30: 11:00:00 via INTRAVENOUS

## 2013-04-30 MED ORDER — NALOXONE HCL 0.4 MG/ML IJ SOLN: 0.4000 mg | INTRAMUSCULAR | Status: DC | PRN

## 2013-04-30 MED ORDER — PNEUMOCOCCAL VAC POLYVALENT 25 MCG/0.5ML IJ INJ
0.5000 mL | INJECTION | INTRAMUSCULAR | Status: AC
  Administered 2013-05-01: 0.5 mL via INTRAMUSCULAR
  Filled 2013-04-30: qty 0.5

## 2013-04-30 MED ORDER — MIDAZOLAM HCL 2 MG/2ML IJ SOLN
INTRAMUSCULAR | Status: AC
  Filled 2013-04-30: qty 2

## 2013-04-30 MED ORDER — ZOLPIDEM TARTRATE 5 MG PO TABS
5.0000 mg | ORAL_TABLET | Freq: Every evening | ORAL | Status: DC | PRN
  Administered 2013-04-30: 5 mg via ORAL
  Filled 2013-04-30: qty 1

## 2013-04-30 MED ORDER — PROPOFOL 10 MG/ML IV BOLUS
INTRAVENOUS | Status: DC | PRN
  Administered 2013-04-30: 200 mg via INTRAVENOUS

## 2013-04-30 MED ORDER — PANTOPRAZOLE SODIUM 40 MG PO TBEC
40.0000 mg | DELAYED_RELEASE_TABLET | Freq: Every day | ORAL | Status: DC
  Administered 2013-05-01: 40 mg via ORAL
  Filled 2013-04-30: qty 1

## 2013-04-30 MED ORDER — DIPHENHYDRAMINE HCL 12.5 MG/5ML PO ELIX
12.5000 mg | ORAL_SOLUTION | Freq: Four times a day (QID) | ORAL | Status: DC | PRN
  Administered 2013-05-01: 12.5 mg via ORAL

## 2013-04-30 MED ORDER — FENTANYL CITRATE 0.05 MG/ML IJ SOLN
INTRAMUSCULAR | Status: AC
  Filled 2013-04-30: qty 5

## 2013-04-30 MED ORDER — CEFAZOLIN SODIUM-DEXTROSE 2-3 GM-% IV SOLR
INTRAVENOUS | Status: AC
  Filled 2013-04-30: qty 50

## 2013-04-30 MED ORDER — HYDROMORPHONE HCL PF 1 MG/ML IJ SOLN: 1.0000 mg | INTRAMUSCULAR | Status: DC | PRN

## 2013-04-30 MED ORDER — DOCUSATE SODIUM 100 MG PO CAPS: 100.0000 mg | ORAL_CAPSULE | Freq: Every day | ORAL | Status: DC | PRN

## 2013-04-30 MED ORDER — HYDROMORPHONE 0.3 MG/ML IV SOLN
INTRAVENOUS | Status: AC
  Filled 2013-04-30: qty 25

## 2013-04-30 MED ORDER — NEOSTIGMINE METHYLSULFATE 1 MG/ML IJ SOLN
INTRAMUSCULAR | Status: DC | PRN
  Administered 2013-04-30: 4 mg via INTRAVENOUS

## 2013-04-30 MED ORDER — DIPHENHYDRAMINE HCL 12.5 MG/5ML PO ELIX
12.5000 mg | ORAL_SOLUTION | Freq: Four times a day (QID) | ORAL | Status: DC | PRN
  Filled 2013-04-30: qty 5

## 2013-04-30 SURGICAL SUPPLY — 62 items
BAG HAMPER (MISCELLANEOUS) ×4 IMPLANT
BENZOIN TINCTURE PRP APPL 2/3 (GAUZE/BANDAGES/DRESSINGS) ×4 IMPLANT
BLADE SURG SZ10 CARB STEEL (BLADE) ×8 IMPLANT
BLADE SURG SZ11 CARB STEEL (BLADE) ×4 IMPLANT
CLOTH BEACON ORANGE TIMEOUT ST (SAFETY) ×4 IMPLANT
COVER LIGHT HANDLE STERIS (MISCELLANEOUS) ×8 IMPLANT
DERMABOND ADVANCED (GAUZE/BANDAGES/DRESSINGS)
DERMABOND ADVANCED .7 DNX12 (GAUZE/BANDAGES/DRESSINGS) IMPLANT
DRAPE LAPAROTOMY TRNSV 102X78 (DRAPE) ×4 IMPLANT
DRAPE PROXIMA HALF (DRAPES) ×4 IMPLANT
DRAPE UTILITY W/TAPE 26X15 (DRAPES) ×8 IMPLANT
DRAPE WARM FLUID 44X44 (DRAPE) ×4 IMPLANT
ELECT REM PT RETURN 9FT ADLT (ELECTROSURGICAL) ×4
ELECTRODE REM PT RTRN 9FT ADLT (ELECTROSURGICAL) ×3 IMPLANT
FILTER SMOKE EVAC LAPAROSHD (FILTER) ×4 IMPLANT
FORMALIN 10 PREFIL 480ML (MISCELLANEOUS) ×4 IMPLANT
GAUZE SPONGE 4X4 16PLY XRAY LF (GAUZE/BANDAGES/DRESSINGS) ×4 IMPLANT
GLOVE BIOGEL PI IND STRL 7.0 (GLOVE) ×9 IMPLANT
GLOVE BIOGEL PI IND STRL 7.5 (GLOVE) ×3 IMPLANT
GLOVE BIOGEL PI INDICATOR 7.0 (GLOVE) ×3
GLOVE BIOGEL PI INDICATOR 7.5 (GLOVE) ×1
GLOVE ECLIPSE 7.0 STRL STRAW (GLOVE) ×4 IMPLANT
GLOVE ECLIPSE 9.0 STRL (GLOVE) ×4 IMPLANT
GLOVE INDICATOR 7.5 STRL GRN (GLOVE) ×4 IMPLANT
GLOVE INDICATOR STER SZ 9 (GLOVE) ×4 IMPLANT
GLOVE SS BIOGEL STRL SZ 6.5 (GLOVE) ×3 IMPLANT
GLOVE SUPERSENSE BIOGEL SZ 6.5 (GLOVE) ×1
GOWN STRL REIN 3XL LVL4 (GOWN DISPOSABLE) ×4 IMPLANT
GOWN STRL REIN XL XLG (GOWN DISPOSABLE) ×12 IMPLANT
INST SET LAPROSCOPIC GYN AP (KITS) ×4 IMPLANT
KIT ROOM TURNOVER AP CYSTO (KITS) ×4 IMPLANT
MANIFOLD NEPTUNE II (INSTRUMENTS) ×4 IMPLANT
NEEDLE HYPO 18GX1.5 BLUNT FILL (NEEDLE) ×4 IMPLANT
NEEDLE HYPO 25X1 1.5 SAFETY (NEEDLE) ×4 IMPLANT
NEEDLE INSUFFLATION 14GA 120MM (NEEDLE) ×4 IMPLANT
NS IRRIG 1000ML POUR BTL (IV SOLUTION) ×4 IMPLANT
PACK PERI GYN (CUSTOM PROCEDURE TRAY) ×4 IMPLANT
PAD ARMBOARD 7.5X6 YLW CONV (MISCELLANEOUS) ×4 IMPLANT
SCALPEL HARMONIC ACE (MISCELLANEOUS) ×4 IMPLANT
SET BASIN LINEN APH (SET/KITS/TRAYS/PACK) ×4 IMPLANT
SOLUTION ANTI FOG 6CC (MISCELLANEOUS) ×4 IMPLANT
SPONGE LAP 18X18 X RAY DECT (DISPOSABLE) ×8 IMPLANT
STRIP CLOSURE SKIN 1/2X4 (GAUZE/BANDAGES/DRESSINGS) ×4 IMPLANT
STRIP CLOSURE SKIN 1/4X3 (GAUZE/BANDAGES/DRESSINGS) ×4 IMPLANT
SUT CHROMIC 0 CT 1 (SUTURE) ×48 IMPLANT
SUT CHROMIC 2 0 CT 1 (SUTURE) ×4 IMPLANT
SUT VIC AB 2-0 CT2 27 (SUTURE) ×4 IMPLANT
SUT VIC AB 4-0 PS2 27 (SUTURE) ×4 IMPLANT
SUT VICRYL 0 UR6 27IN ABS (SUTURE) ×4 IMPLANT
SUT VICRYL 4 0 KS 27 (SUTURE) ×4 IMPLANT
SYR 20CC LL (SYRINGE) ×4 IMPLANT
SYR 30ML LL (SYRINGE) ×4 IMPLANT
SYR BULB IRRIGATION 50ML (SYRINGE) ×4 IMPLANT
SYRINGE 10CC LL (SYRINGE) ×4 IMPLANT
TOWEL BLUE STERILE X RAY DET (MISCELLANEOUS) ×4 IMPLANT
TRAY FOLEY CATH 16FR SILVER (SET/KITS/TRAYS/PACK) ×4 IMPLANT
TROCAR Z-THAD FIOS HNDL 12X100 (TROCAR) ×4 IMPLANT
TROCAR Z-THRD FIOS HNDL 11X100 (TROCAR) ×4 IMPLANT
TROCAR Z-THREAD OPTICAL 5X100M (TROCAR) ×4 IMPLANT
TROCAR Z-THREAD SLEEVE 11X100 (TROCAR) ×4 IMPLANT
WARMER LAPAROSCOPE (MISCELLANEOUS) ×4 IMPLANT
YANKAUER SUCT BULB TIP 10FT TU (MISCELLANEOUS) ×4 IMPLANT

## 2013-04-30 NOTE — Progress Notes (Signed)
Pt ambulated in hallway. Pt made it to the nurses' desk and back to room with no distress noted. Pt had no SOB or dyspnea. Will continue to monitor.

## 2013-04-30 NOTE — Addendum Note (Signed)
Addendum created 04/30/13 1058 by Franco Nones, CRNA   Modules edited: Anesthesia Responsible Staff

## 2013-04-30 NOTE — Transfer of Care (Signed)
Immediate Anesthesia Transfer of Care Note  Patient: Tara Moon  Procedure(s) Performed: Procedure(s): HYSTERECTOMY SUPRACERVICAL ABDOMINAL (N/A) BILATERAL SALPINGECTOMY (Bilateral)  Patient Location: PACU  Anesthesia Type:General  Level of Consciousness: awake, alert , oriented and patient cooperative  Airway & Oxygen Therapy: Patient Spontanous Breathing and Patient connected to face mask oxygen  Post-op Assessment: Report given to PACU RN  Post vital signs: Reviewed and stable  Complications: No apparent anesthesia complications

## 2013-04-30 NOTE — H&P (View-Only) (Signed)
Tara Moon is an 32 y.o. female. 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 She has had discussion of treatment options, and desires hysterectomy, planned as a supracervical hysterectomy.   Pertinent Gynecological History: Menses: flow is excessive with use of numerous pads or tampons on heaviest days Bleeding: dysfunctional uterine bleeding Contraception: none DES exposure: unknown Blood transfusions: none Sexually transmitted diseases: no past history Previous GYN Procedures:   Last mammogram:  Date:  Last pap: normal Date:  OB History: G6, P4024   Menstrual History: Menarche age:  Patient's last menstrual period was 04/04/2013.    Past Medical History  Diagnosis Date  . Anemia   . Fibroids     Past Surgical History  Procedure Laterality Date  . Tubal ligation    . Gallbladder surgery      No family history on file.  Social History:  reports that she has been smoking Cigarettes.  She has a 1.5 pack-year smoking history. She has never used smokeless tobacco. She reports that  drinks alcohol. She reports that she uses illicit drugs (Marijuana).  Allergies:  Allergies  Allergen Reactions  . Aspirin Rash     (Not in a hospital admission)  ROS  Last menstrual period 04/04/2013. Physical Exam Physical Examination: General appearance - alert, well appearing, and in no distress, oriented to person, place, and time and normal appearing weight Mental status - alert, oriented to person, place, and time Neck - supple, no significant adenopathy Heart - normal rate and regular rhythm Abdomen - soft, nontender, nondistended, no masses or organomegaly Pelvic - normal external genitalia, vulva, vagina, cervix, uterus and adnexa, UTERUS: uterus is normal size, shape, consistency and nontender, enlarged to 8 week's size See u/s report for sonohysterogram No results found for this or any previous visit (from the past 24  hour(s)).  No results found.  Assessment/Plan: Submucus fibroid, and resultant anemia, corrected., admitted for supracervical hysterectomy  Miangel Flom V 04/17/2013, 6:03 PM

## 2013-04-30 NOTE — Brief Op Note (Signed)
04/30/2013  9:48 AM  PATIENT:  Tara Moon  32 y.o. female  PRE-OPERATIVE DIAGNOSIS:  uterine fibroids dysfunctional uterine bleeding anemia, secondary to blood loss  POST-OPERATIVE DIAGNOSIS:  uterine fibroids dysfunctional uterine bleeding anemia, secondary to blood loss  PROCEDURE:  Procedure(s): HYSTERECTOMY SUPRACERVICAL ABDOMINAL (N/A) BILATERAL SALPINGECTOMY (Bilateral)  SURGEON:  Surgeon(s) and Role:    * Tilda Burrow, MD - Primary  PHYSICIAN ASSISTANT: none  ASSISTANTS: none   ANESTHESIA:   regional and spinal  EBL:  Total I/O In: 1700 [I.V.:1700] Out: 150 [Urine:100; Blood:50]  BLOOD ADMINISTERED:none  DRAINS: Urinary Catheter (Foley)   LOCAL MEDICATIONS USED:  Amount: 31 cc ml and OTHER Exparel  SPECIMEN:  Source of Specimen:  uterus and bilateral tubes, stitch in  Right tube  DISPOSITION OF SPECIMEN:  PATHOLOGY  COUNTS:  YES  TOURNIQUET:  * No tourniquets in log *  DICTATION: .Dragon Dictation  PLAN OF CARE: Admit to inpatient   PATIENT DISPOSITION:  PACU - hemodynamically stable.   Delay start of Pharmacological VTE agent (>24hrs) due to surgical blood loss or risk of bleeding: not applicable

## 2013-04-30 NOTE — Progress Notes (Signed)
Day of Surgery Procedure(s) (LRB): HYSTERECTOMY SUPRACERVICAL ABDOMINAL (N/A) BILATERAL SALPINGECTOMY (Bilateral)  Subjective: Patient reports incisional pain and tolerating PO.   Desires to d/c PCA and PCO2 monitor  Objective: I have reviewed patient's vital signs, intake and output and medications.    Assessment: s/p Procedure(s): HYSTERECTOMY SUPRACERVICAL ABDOMINAL (N/A) BILATERAL SALPINGECTOMY (Bilateral): stable  Plan: d/c foley in am. possible d/c in am. switch to p.o. analgesics  LOS: 0 days    Alisse Tuite V 04/30/2013, 10:08 PM

## 2013-04-30 NOTE — Op Note (Signed)
PATIENT:  Tara Moon  32 y.o. female  PRE-OPERATIVE DIAGNOSIS:  uterine fibroids dysfunctional uterine bleeding anemia, secondary to blood loss  POST-OPERATIVE DIAGNOSIS:  uterine fibroids dysfunctional uterine bleeding anemia, secondary to blood loss  PROCEDURE:  Procedure(s): HYSTERECTOMY SUPRACERVICAL ABDOMINAL (N/A) BILATERAL SALPINGECTOMY (Bilateral)  SURGEON:  Surgeon(s) and Role:    * Tilda Burrow, MD - Primary   Details of procedure: Patient was taken to the operating room prepped and draped for lower bowel surgery with Foley catheter in place timeout was conducted and surgical procedure confirmed by operative team. Ancef administered 2 g intravenous. A transverse lower bowel incision was made, 8-9 cm in length, and the method of Pfannenstiel with careful entry of the peritoneal cavity while elevating the peritoneum, and packing of the bowel away with 2 moist laparotomy tapes and a laparotomy pack. Balfour retractor was in place and was confirmed as not contacting the retroperitoneum. Bladder blade was in place. Uterus could be elevated by grasping it at the cornu and round ligament with Kelly clamps, with the left round ligament elevated clamped cut and suture ligated with 0 chromic suture. Bladder flap was developed anteriorly. The left utero-ovarian ligament was isolated doubly clamped with Kelly clamps transected and  suture ligated x2. Uterine vessels on the left side were isolated, crossclamped with Heaney clamp times x2, transected and suture ligated with 0 chromic. Upper cardinal ligament was clamped with straight Heaney clamp transected with knife, and then suture ligated with 0 chromic. The opposite side was treated in a similar fashion. At this point the uterus could be amputated off of the cervix. Approximately 2 cm of cervix was left in place. Bovie cautery was used to circumscribe the cervix and the central portion was removed and a conical shaped specimen obtained.  The vaginal cuff was then attached to the cardinal ligaments with interrupted suture of 0 chromic, and the central portion of the amputated stump of the cervix was oversewn with 3 interrupted sutures of 0 chromic. Pelvis was irrigated and the pedicles inspected for hemostasis. One additional figure-of-eight suture was required on the right side of the cervical cuff, being careful to stay well away from the adnexal structures and bladder. Re\re irrigation showed good hemostasis. Salpingectomy: The left fallopian tube could be identified grasp with Babcock clamp, and using unipolar cautery while staying well away from all other structures the salpingectomy could be performed, with good hemostasis, dissecting just beneath the fallopian tube. Specimen on the right was removed in a similar fashion and tagged for future identification of the side. Pedicles were again inspected and confirmed as hemostatic. Cervical stump was oversewn with the bladder flap with 2 interrupted suture of 2-0 chromic. Laparotomy equipment was removed, and anterior peritoneum closed with 2-0 chromic, the fascia closed with 0 Vicryl, subcuticular tissues infiltrated with exparel, and then interrupted subcuticular reapproximation with 2-0 plain performed x2 and then subcuticular 4-0 Vicryl skin closure completed the procedure sponge and needle counts were correct patient to recovery in stable condition

## 2013-04-30 NOTE — Anesthesia Preprocedure Evaluation (Signed)
Anesthesia Evaluation  Patient identified by MRN, date of birth, ID band Patient awake    Reviewed: Allergy & Precautions, H&P , NPO status , Patient's Chart, lab work & pertinent test results  Airway Mallampati: I  TM Distance: >3 FB     Dental  (+) Teeth Intact   Pulmonary neg pulmonary ROS,  breath sounds clear to auscultation        Cardiovascular negative cardio ROS  Rhythm:Regular Rate:Normal     Neuro/Psych    GI/Hepatic negative GI ROS,   Endo/Other    Renal/GU      Musculoskeletal   Abdominal   Peds  Hematology   Anesthesia Other Findings   Reproductive/Obstetrics                             Anesthesia Physical Anesthesia Plan  ASA: I  Anesthesia Plan: General   Post-op Pain Management:    Induction: Intravenous  Airway Management Planned: Oral ETT  Additional Equipment:   Intra-op Plan:   Post-operative Plan: Extubation in OR  Informed Consent: I have reviewed the patients History and Physical, chart, labs and discussed the procedure including the risks, benefits and alternatives for the proposed anesthesia with the patient or authorized representative who has indicated his/her understanding and acceptance.     Plan Discussed with:   Anesthesia Plan Comments:         Anesthesia Quick Evaluation  

## 2013-04-30 NOTE — Preoperative (Signed)
Beta Blockers   Reason not to administer Beta Blockers:Not Applicable 

## 2013-04-30 NOTE — Anesthesia Postprocedure Evaluation (Addendum)
  Anesthesia Post-op Note  Patient: Tara Moon  Procedure(s) Performed: Procedure(s): HYSTERECTOMY SUPRACERVICAL ABDOMINAL (N/A) BILATERAL SALPINGECTOMY (Bilateral)  Patient Location: PACU  Anesthesia Type:General  Level of Consciousness: awake, alert  and oriented  Airway and Oxygen Therapy: Patient Spontanous Breathing and Patient connected to face mask oxygen  Post-op Pain: mild  Post-op Assessment: Post-op Vital signs reviewed, Patient's Cardiovascular Status Stable, Respiratory Function Stable and Patent Airway  Post-op Vital Signs: Reviewed and stable  Complications: No apparent anesthesia complications 04/1713 Patient discharged today.

## 2013-04-30 NOTE — Interval H&P Note (Signed)
History and Physical Interval Note:  04/30/2013 7:31 AM  Tara Moon  has presented today for surgery, with the diagnosis of uterine fibroids dysfunctional uterine bleeding anemia, secondary to blood loss  The various methods of treatment have been discussed with the patient and family. After consideration of risks, benefits and other options for treatment, the patient has consented to  Supracervical hysterectomy as a surgical intervention .  The procedure is not planned as a laparoscopic procedure as the patient desires cervical preservation. The Fallopian tubes are to be removed as a part of the procedure, and this has been specifically discussed this morning prior to the procedure, and specifically consented as part of preop consent. The patient's history has been reviewed, patient examined, no change in status, stable for surgery.  I have reviewed the patient's chart and labs.  Questions were answered to the patient's satisfaction.     Tilda Burrow

## 2013-05-01 ENCOUNTER — Encounter (HOSPITAL_COMMUNITY): Payer: Self-pay | Admitting: Obstetrics and Gynecology

## 2013-05-01 LAB — BASIC METABOLIC PANEL
BUN: 4 mg/dL — ABNORMAL LOW (ref 6–23)
CO2: 26 mEq/L (ref 19–32)
Calcium: 8.6 mg/dL (ref 8.4–10.5)
Creatinine, Ser: 0.76 mg/dL (ref 0.50–1.10)
GFR calc Af Amer: >90 mL/min (ref >90)

## 2013-05-01 LAB — CBC
MCH: 29.3 pg (ref 26.0–34.0)
MCV: 90.7 fL (ref 78.0–100.0)
Platelets: 250 10*3/uL (ref 150–400)
RDW: 21.7 % — ABNORMAL HIGH (ref 11.5–15.5)

## 2013-05-01 MED ORDER — KETOROLAC TROMETHAMINE 10 MG PO TABS: 10.0000 mg | ORAL_TABLET | Freq: Four times a day (QID) | ORAL | Status: DC | PRN

## 2013-05-01 MED ORDER — OXYCODONE-ACETAMINOPHEN 5-325 MG PO TABS: 1.0000 | ORAL_TABLET | ORAL | Status: DC | PRN

## 2013-05-01 NOTE — Progress Notes (Signed)
Pt is to be discharged home today. Pt is in NAD, IV is out, all paperwork has been reviewed/discussed with patient, and there are no questions/concerns at this time. Assessment is unchanged from this morning. Pt is to be accompanied downstairs by staff and family via wheelchair.  

## 2013-05-01 NOTE — Progress Notes (Signed)
Pt has voided on her own since catheter was removed. Will continue to monitor.

## 2013-05-01 NOTE — Discharge Summary (Signed)
Physician Discharge Summary  Patient ID: Tara Moon MRN: [id number] DOB/AGE: [birth year] 32 y.o.  Admit date: 04/30/2013 Discharge date: 05/01/2013  Admission Diagnoses: Submucous fibroid, anemia  Discharge Diagnoses: Same Active Problems:   * No active hospital problems. *   Discharged Condition: good  Hospital Course: Patient was observed overnight after hysterectomy performed as an outpatient procedure. Patient did so well that planned admission was a will be converted to observation with prolonged aftercare. Patient had minimal discomfort tolerated oral analgesics within 24 hours, had normal bowel activity. Hemoglobin was 8.8 postop slightly increased drop compared to anticipated, partially attributable to hemodilution but raising the question of postop oozing. The abdomen is soft nondistended bowel sounds are completely stable Vital signs.vs BP 96/68  Pulse 79  Temp(Src) 98.9 F (37.2 C) (Oral)  Resp 16  Ht 5\' 5"  (1.651 m)  Wt 148 lb (67.132 kg)  BMI 24.63 kg/m2  SpO2 100%   Consults: None  Significant Diagnostic Studies: labs:  CBC    Component Value Date/Time   WBC 10.8* 05/01/2013 0550   RBC 3.00* 05/01/2013 0550   HGB 8.8* 05/01/2013 0550   HGB 11.3* 04/08/2013 1022   HGB 8.3 10/11/2012   HCT 27.2* 05/01/2013 0550   HCT 27 10/11/2012   PLT 250 05/01/2013 0550   PLT 350 10/11/2012   MCV 90.7 05/01/2013 0550   MCH 29.3 05/01/2013 0550   MCHC 32.4 05/01/2013 0550   RDW 21.7* 05/01/2013 0550     Treatments: surgery: Supracervical hysterectomy, bilateral salpingectomy  Discharge Exam: Blood pressure 96/68, pulse 79, temperature 98.9 F (37.2 C), temperature source Oral, resp. rate 16, height 5\' 5"  (1.651 m), weight 148 lb (67.132 kg), SpO2 100.00%. General: alert, cooperative and no distress  Resp: clear to auscultation bilaterally  Cardio: normal apical impulse  Abdomen soft bowel sounds active incision clean   Disposition: Discharge home  Discharge Orders   Future Appointments Provider Department Dept Phone   05/13/2013 1:45 PM Tilda Burrow, MD [facility] [phone]   Future Orders Complete By Expires   Diet - low sodium heart healthy  As directed    Discharge instructions  As directed    Comments:     General Gynecological Post-Operative Instructions You may expect to feel dizzy, weak, and drowsy for as long as 24 hours after receiving the medicine that made you sleep (anesthetic). The following information pertains to your recovery period for the first 24 hours following surgery.  Do not drive a car, ride a bicycle, participate in physical activities, or take public transportation until you are done taking narcotic pain medicines or as directed by your caregiver.  Do not drink alcohol or take tranquilizers.  Do not take medicine that has not been prescribed by your caregiver.  Do not sign important papers or make important decisions while on narcotic pain medicines.  Have a responsible person with you.  CARE OF INCISION  Keep incision clean and dry. Take showers instead of baths until your caregiver gives you permission to take baths. Check with your caregiver if you have tubes coming from the wound site.  Avoid heavy lifting (more than 10 pounds/4.5 kilograms), pushing, or pulling.  Avoid activities that may risk injury to your surgical site.  Only take over-the-counter or prescription medicines for pain, discomfort, or fever as directed by your caregiver. Do not take aspirin. It can make you bleed. Take medicines (antibiotics) that kill germs as directed.  Call the office or go to the MAU  if:  You feel sick to your stomach (nauseous).  You start to throw up (vomit).  You have trouble eating or drinking.  You have an oral temperature above 100.4.  You have constipation that is not helped by adjusting diet or increasing fluid intake. Pain medicines are a common cause of constipation.  SEEK IMMEDIATE MEDICAL CARE IF:  You have  persistent dizziness.  You have difficulty breathing or a congested sounding (croupy) cough.  You have an oral temperature above 102.5, not controlled by medicine.  There is increasing pain or tenderness near or in the surgical site.  ExitCare Patient Information 2011 [place], [place].   Increase activity slowly  As directed    Leave dressing on - Keep it clean, dry, and intact until clinic visit  As directed    Scheduling Instructions:     May remove steristrips in 1 wk.       Medication List    STOP taking these medications       ferumoxytol 510 MG/17ML Soln injection  Commonly known as:  FERAHEME     ibuprofen 800 MG tablet  Commonly known as:  ADVIL,MOTRIN     megestrol 40 MG tablet  Commonly known as:  MEGACE     multivitamins with iron Tabs tablet      TAKE these medications       ketorolac 10 MG tablet  Commonly known as:  TORADOL  Take 1 tablet (10 mg total) by mouth every 6 (six) hours as needed for pain.     oxyCODONE-acetaminophen 5-325 MG per tablet  Commonly known as:  PERCOCET/ROXICET  Take 1-2 tablets by mouth every 4 (four) hours as needed.           Follow-up Information   Follow up with Tilda Burrow, MD In 2 weeks. (Or earlier as needed if symptoms worsen)    Specialties:  Obstetrics and Gynecology, Radiology   Contact information:   [address] [place] [address] [phone]       Signed: Tilda Burrow 05/01/2013, 8:01 AM

## 2013-05-01 NOTE — Progress Notes (Signed)
1 Day Post-Op Procedure(s) (LRB): HYSTERECTOMY SUPRACERVICAL ABDOMINAL (N/A) BILATERAL SALPINGECTOMY (Bilateral)  Subjective: Patient reports incisional pain, tolerating PO and no problems voiding.  Foley out, notes gassy discomforts, did not take laxative pre-op.  Objective: I have reviewed patient's vital signs, medications and labs.  General: alert, cooperative and no distress Resp: clear to auscultation bilaterally Cardio: normal apical impulse Abdomen soft bowel sounds active incision clean CBC    Component Value Date/Time   WBC 10.8* 05/01/2013 0550   RBC 3.00* 05/01/2013 0550   HGB 8.8* 05/01/2013 0550   HGB 11.3* 04/08/2013 1022   HGB 8.3 10/11/2012   HCT 27.2* 05/01/2013 0550   HCT 27 10/11/2012   PLT 250 05/01/2013 0550   PLT 350 10/11/2012   MCV 90.7 05/01/2013 0550   MCH 29.3 05/01/2013 0550   MCHC 32.4 05/01/2013 0550   RDW 21.7* 05/01/2013 0550     Assessment: s/p Procedure(s): HYSTERECTOMY SUPRACERVICAL ABDOMINAL (N/A) BILATERAL SALPINGECTOMY (Bilateral): stable  Plan: Advance diet Discharge home  LOS: 1 day    Kline Bulthuis V 05/01/2013, 7:52 AM

## 2013-05-07 NOTE — Progress Notes (Signed)
UR Chart Review Completed  

## 2013-05-13 ENCOUNTER — Ambulatory Visit (INDEPENDENT_AMBULATORY_CARE_PROVIDER_SITE_OTHER): Payer: Self-pay | Admitting: Obstetrics and Gynecology

## 2013-05-13 ENCOUNTER — Encounter: Payer: Self-pay | Admitting: Obstetrics and Gynecology

## 2013-05-13 VITALS — BP 120/90 | Ht 64.0 in | Wt 152.6 lb

## 2013-05-13 DIAGNOSIS — D259 Leiomyoma of uterus, unspecified: Secondary | ICD-10-CM

## 2013-05-13 NOTE — Progress Notes (Signed)
  Assessment:  Post-Op status post supracervical hysterectomy for fibroids and bleeding with anemia.2 weeks ago.  Doing well postoperatively.   Plan:  1. Continue any current medications. 2. Wound care discussed. 3. Activity restrictions: none 4. Anticipated return to work: not applicable. 5. Follow up in 4 weeks.  Subjective:  Tara Moon is a 32 y.o. female who presents to the clinic 2 weeks status post supracervical hysterectomy.  She has been eating a regular diet without difficulty.  Bowel movements are normal. The patient is not having any pain.  Review of Systems Negative except as per HPI   Objective:  BP 120/90  Ht 5\' 4"  (1.626 m)  Wt 152 lb 9.6 oz (69.219 kg)  BMI 26.18 kg/m2  LMP 04/04/2013 General:Well developed, well nourished.  No acute distress. Abdomen: Bowel sounds normal, soft, non-tender. P Incision(s):Healing well, no drainage, no erythema, no hernia, no swelling, no dehiscence, incision well approximated.

## 2013-05-19 NOTE — Telephone Encounter (Signed)
Late entry: By recall at this date, pt had questions re: future surgery that were addressed. Remaining questions addressed during preop evals.

## 2013-06-10 ENCOUNTER — Encounter: Payer: Self-pay | Admitting: Obstetrics and Gynecology

## 2013-06-10 ENCOUNTER — Ambulatory Visit (INDEPENDENT_AMBULATORY_CARE_PROVIDER_SITE_OTHER): Payer: Self-pay | Admitting: Obstetrics and Gynecology

## 2013-06-10 VITALS — BP 108/72 | Ht 66.0 in | Wt 154.0 lb

## 2013-06-10 DIAGNOSIS — Z9889 Other specified postprocedural states: Secondary | ICD-10-CM

## 2013-06-10 NOTE — Patient Instructions (Signed)
Incision care reviewed

## 2013-06-10 NOTE — Progress Notes (Signed)
Patient ID: Tara Moon, female   DOB: 1981/01/30, 32 y.o.   MRN: [id number] Pt here today for post op, Hysterectomy Supracervical abdominal. No complaints at this time.  Assessment:  Post-Op status post supracervical hysterectomy for fibroids.6 weeks ago.  She has one small area in the incision that has not completely healed on the left side and is tender .Hair follicle and extracted Doing well postoperatively.   Plan:  1. Continue any current medications. 2. Wound care discussed. 3. Activity restrictions: none 4. Anticipated return to work: not applicable. 5. Follow up in When necessary weeks.  Subjective:  Tara Moon is.  She has been eating a regular diet without difficulty.  Bowel movements are normal. The patient is not having any pain.  Review of Systems Negative except as per HPI   Objective:  BP 108/72  Ht 5\' 6"  (1.676 m)  Wt 154 lb (69.854 kg)  BMI 24.87 kg/m2  LMP 04/04/2013 General:Well developed, well nourished.  No acute distress. Abdomen: Bowel sounds normal, soft, non-tender. Pelvic Exam: External Genitalia:  Normal. Vagina: Normal Bimanual: Normal Cervix: Normal Uterus: Absent Normal Adnexa: Normal Incision(s):Healing well, no drainage, no erythema, no hernia, no swelling, no dehiscence, incision well approximated. A small area on the left side of the incision has a Hair follicle which is removed and eschar which is peeled off. This should heal gradually if it does continue to be irritated we will trim out the area of inflammation in 6 months

## 2014-05-28 ENCOUNTER — Encounter (HOSPITAL_COMMUNITY): Payer: Self-pay | Admitting: Emergency Medicine

## 2014-05-28 ENCOUNTER — Emergency Department (HOSPITAL_COMMUNITY)
Admission: EM | Admit: 2014-05-28 | Discharge: 2014-05-28 | Disposition: A | Discharge status: Home / Self Care | Payer: Medicaid Other | Attending: Emergency Medicine | Admitting: Emergency Medicine

## 2014-05-28 ENCOUNTER — Emergency Department (HOSPITAL_COMMUNITY): Payer: Medicaid Other

## 2014-05-28 DIAGNOSIS — Z862 Personal history of diseases of the blood and blood-forming organs and certain disorders involving the immune mechanism: Secondary | ICD-10-CM | POA: Diagnosis not present

## 2014-05-28 DIAGNOSIS — Z8739 Personal history of other diseases of the musculoskeletal system and connective tissue: Secondary | ICD-10-CM | POA: Insufficient documentation

## 2014-05-28 DIAGNOSIS — R29898 Other symptoms and signs involving the musculoskeletal system: Secondary | ICD-10-CM

## 2014-05-28 DIAGNOSIS — R200 Anesthesia of skin: Secondary | ICD-10-CM | POA: Diagnosis not present

## 2014-05-28 DIAGNOSIS — Z720 Tobacco use: Secondary | ICD-10-CM | POA: Diagnosis not present

## 2014-05-28 DIAGNOSIS — M6281 Muscle weakness (generalized): Secondary | ICD-10-CM | POA: Diagnosis not present

## 2014-05-28 NOTE — ED Notes (Signed)
Pt reports woke up 1 week ago with r hand "limp."  Pt says is no better, reports feels like she can't control her hand.   Denies any pain.

## 2014-05-30 NOTE — ED Provider Notes (Signed)
CSN: [id number]     Arrival date & time 05/28/14  0930 History   First MD Initiated Contact with Patient 05/28/14 1003     Chief Complaint  Patient presents with  . hand weakness      (Consider location/radiation/quality/duration/timing/severity/associated sxs/prior Treatment) The history is provided by the patient.   Tara Moon is a 33 y.o.right handed female presenting with weakness in her right hand which she woke with one week ago.  She denies pain, tingling or burning in the hand, denies injury, states it feels slightly numb and has to concentrate when using it to grasp objects, feeling she has less control.  She denies symptoms in her forearm, elbow, shoulder.  Denies history of similar symptoms.  She had no fevers, chills, headache, neck pain, stiffness or history of injury.  She has taken no medicines for this condition.  She has found no alleviators.     Past Medical History  Diagnosis Date  . Anemia   . Fibroids    Past Surgical History  Procedure Laterality Date  . Tubal ligation    . Gallbladder surgery    . Supracervical abdominal hysterectomy N/A 04/30/2013    Procedure: HYSTERECTOMY SUPRACERVICAL ABDOMINAL;  Surgeon: Jonnie Kind, MD;  Location: [facility];  Service: Gynecology;  Laterality: N/A;  . Bilateral salpingectomy Bilateral 04/30/2013    Procedure: BILATERAL SALPINGECTOMY;  Surgeon: Jonnie Kind, MD;  Location: [facility];  Service: Gynecology;  Laterality: Bilateral;   No family history on file. History  Substance Use Topics  . Smoking status: Current Every Day Smoker -- 0.50 packs/day for 3 years    Types: Cigarettes  . Smokeless tobacco: Never Used  . Alcohol Use: Yes     Comment: occ   OB History   Grav Para Term Preterm Abortions TAB SAB Ect Mult Living   6 4 4  2  1   4      Review of Systems  Constitutional: Negative for fever.  HENT: Negative for congestion and sore throat.   Eyes: Negative.   Respiratory: Negative for chest tightness  and shortness of breath.   Cardiovascular: Negative for chest pain.  Gastrointestinal: Negative for nausea and abdominal pain.  Genitourinary: Negative.   Musculoskeletal: Negative for arthralgias, joint swelling and neck pain.  Skin: Negative.  Negative for rash and wound.  Neurological: Positive for weakness. Negative for dizziness, speech difficulty, light-headedness, numbness and headaches.  Psychiatric/Behavioral: Negative.       Allergies  Aspirin  Home Medications   Prior to Admission medications   Medication Sig Start Date End Date Taking? Authorizing Provider  ibuprofen (ADVIL,MOTRIN) 200 MG tablet Take 400 mg by mouth every 6 (six) hours as needed for moderate pain.   Yes Historical Provider, MD   BP 129/85  Pulse 73  Temp(Src) 98.2 F (36.8 C) (Oral)  Resp 18  Ht 5\' 5"  (1.651 m)  Wt 145 lb (65.772 kg)  BMI 24.13 kg/m2  SpO2 100%  LMP 04/04/2013 Physical Exam  Nursing note and vitals reviewed. Constitutional: She is oriented to person, place, and time. She appears well-developed and well-nourished.  HENT:  Head: Normocephalic and atraumatic.  Eyes: Conjunctivae are normal.  Neck: Normal range of motion.  Cardiovascular: Normal rate, regular rhythm, normal heart sounds and intact distal pulses.   Pulmonary/Chest: Effort normal and breath sounds normal. She has no wheezes.  Abdominal: Soft. Bowel sounds are normal. There is no tenderness.  Musculoskeletal: Normal range of motion.  Cervical back: She exhibits normal range of motion and no tenderness.  Neurological: She is alert and oriented to person, place, and time. She displays normal reflexes. A sensory deficit is present. No cranial nerve deficit. Coordination normal.  Reflex Scores:      Bicep reflexes are 2+ on the right side and 2+ on the left side. Decreased sensation to fine touch entire right hand, without specific nerve distribution. Normal sensation in forearm.  Grip strength 4/5 right, 5/5 left.   Negative pronator drift. Cap refill less than 2 seconds in digits.  No wrist drop.    Skin: Skin is warm and dry.  Psychiatric: She has a normal mood and affect.    ED Course  Procedures (including critical care time) Labs Review Labs Reviewed - No data to display  Imaging Review No results found.   EKG Interpretation None       Ct Head Wo Contrast  05/28/2014   CLINICAL DATA:  Right hand weakness for 1 week.  No pain.  EXAM: CT HEAD WITHOUT CONTRAST  CT CERVICAL SPINE WITHOUT CONTRAST  TECHNIQUE: Multidetector CT imaging of the head and cervical spine was performed following the standard protocol without intravenous contrast. Multiplanar CT image reconstructions of the cervical spine were also generated.  COMPARISON:  None.  FINDINGS: CT HEAD FINDINGS  No acute intracranial abnormality. Specifically, no hemorrhage, hydrocephalus, mass lesion, acute infarction, or significant intracranial injury. No acute calvarial abnormality. Visualized paranasal sinuses and mastoids clear. Orbital soft tissues unremarkable.  CT CERVICAL SPINE FINDINGS  Normal alignment. Loss of normal cervical lordosis. Prevertebral soft tissues are normal. Disc spaces are maintained. No neural foraminal narrowing. No fracture. No epidural or paraspinal hematoma.  IMPRESSION: No intracranial abnormality.  Mild cervical straightening.  Otherwise unremarkable C-spine CT.   Electronically Signed   By: Rolm Baptise M.D.   On: 05/28/2014 11:43   Ct Cervical Spine Wo Contrast  05/28/2014   CLINICAL DATA:  Right hand weakness for 1 week.  No pain.  EXAM: CT HEAD WITHOUT CONTRAST  CT CERVICAL SPINE WITHOUT CONTRAST  TECHNIQUE: Multidetector CT imaging of the head and cervical spine was performed following the standard protocol without intravenous contrast. Multiplanar CT image reconstructions of the cervical spine were also generated.  COMPARISON:  None.  FINDINGS: CT HEAD FINDINGS  No acute intracranial abnormality.  Specifically, no hemorrhage, hydrocephalus, mass lesion, acute infarction, or significant intracranial injury. No acute calvarial abnormality. Visualized paranasal sinuses and mastoids clear. Orbital soft tissues unremarkable.  CT CERVICAL SPINE FINDINGS  Normal alignment. Loss of normal cervical lordosis. Prevertebral soft tissues are normal. Disc spaces are maintained. No neural foraminal narrowing. No fracture. No epidural or paraspinal hematoma.  IMPRESSION: No intracranial abnormality.  Mild cervical straightening.  Otherwise unremarkable C-spine CT.   Electronically Signed   By: Rolm Baptise M.D.   On: 05/28/2014 11:43     MDM   Final diagnoses:  Weakness of right hand    Patients labs and/or radiological studies were viewed and considered during the medical decision making and disposition process. Pt with 1 week h/o right hand weakness. No findings on brain and c spine Ct scan to suggest cva or cervical neuropathy.  Pt was referred to neurology for f/u eval - may need nerve testing studies, other eval as deemed approp by neuro.  Pt understands and agrees with plan.  Will call for appt for f/u. Advised return here in the interim for any worsened sx.  Pt discussed with Dr. Thurnell Garbe  prior to dispo.    Evalee Jefferson, PA-C 05/30/14 1344

## 2014-05-30 NOTE — ED Provider Notes (Signed)
Medical screening examination/treatment/procedure(s) were performed by non-physician practitioner and as supervising physician I was immediately available for consultation/collaboration.   EKG Interpretation None        Francine Graven, DO 05/30/14 1946

## 2014-06-16 ENCOUNTER — Encounter (HOSPITAL_COMMUNITY): Payer: Self-pay | Admitting: Emergency Medicine

## 2015-11-20 IMAGING — CT CT HEAD W/O CM
4 of 5 series · 14 of 47 positions shown, 15 images · non-contrast
Comparison: None.

CLINICAL DATA: Right hand weakness for 1 week.  No pain.

EXAM:
CT HEAD WITHOUT CONTRAST
CT CERVICAL SPINE WITHOUT CONTRAST
TECHNIQUE: Multidetector CT imaging of the head and cervical spine was
performed following the standard protocol without intravenous
contrast. Multiplanar CT image reconstructions of the cervical spine
were also generated.

[Series 2: headseq 4.8 h37s · axial · 0.46mm/px · z∈[+315,+373]mm · 2 of 36 slices shown, 3 images]
[im 12/36  brain]
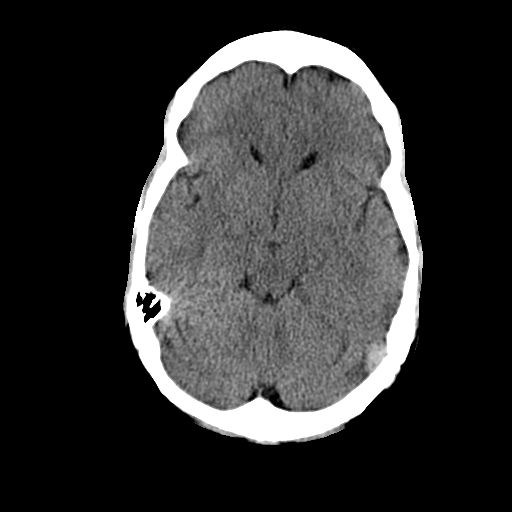
[im 12/36  bone]
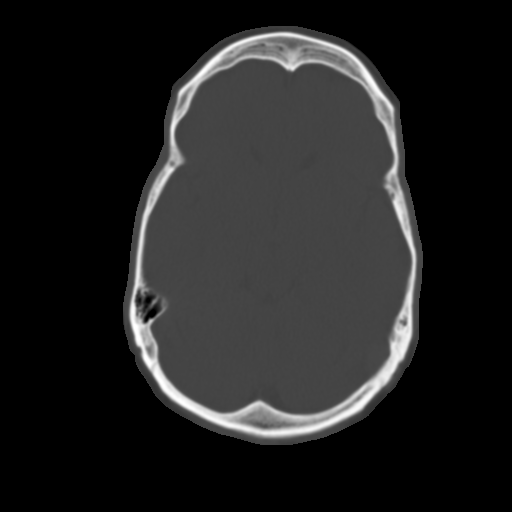
[im 24/36  brain]
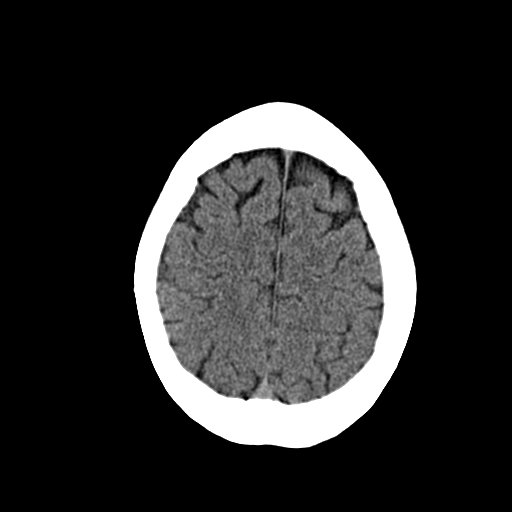

[Series 7: sagittal bone 2.0 · sagittal · 0.22mm/px · 3 of 60 slices shown]
[im 20/60  brain]
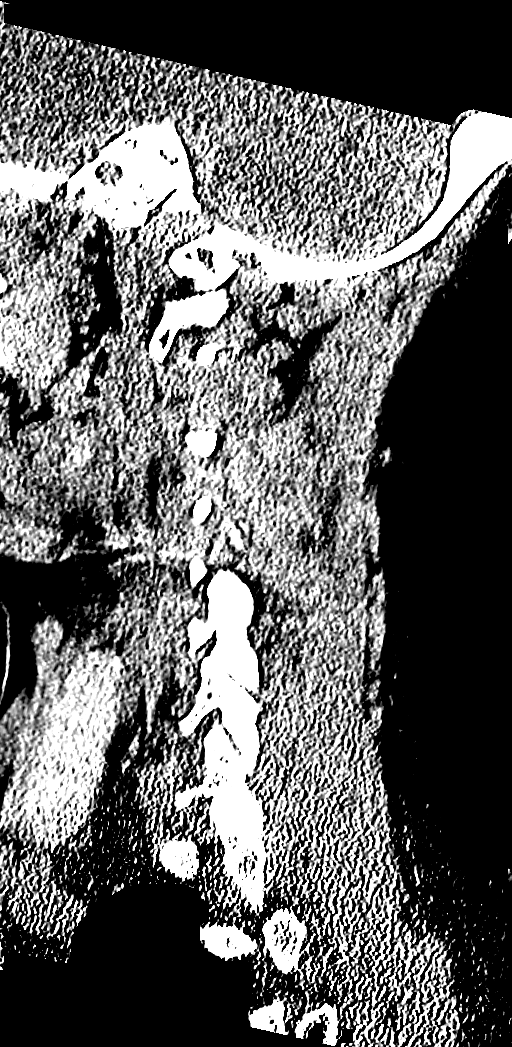
[im 30/60  brain]
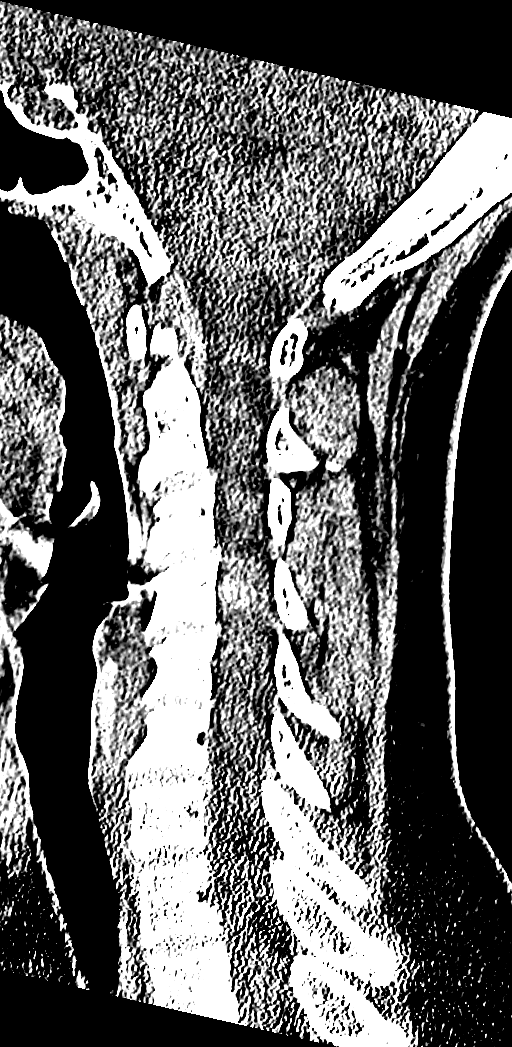
[im 40/60  brain]
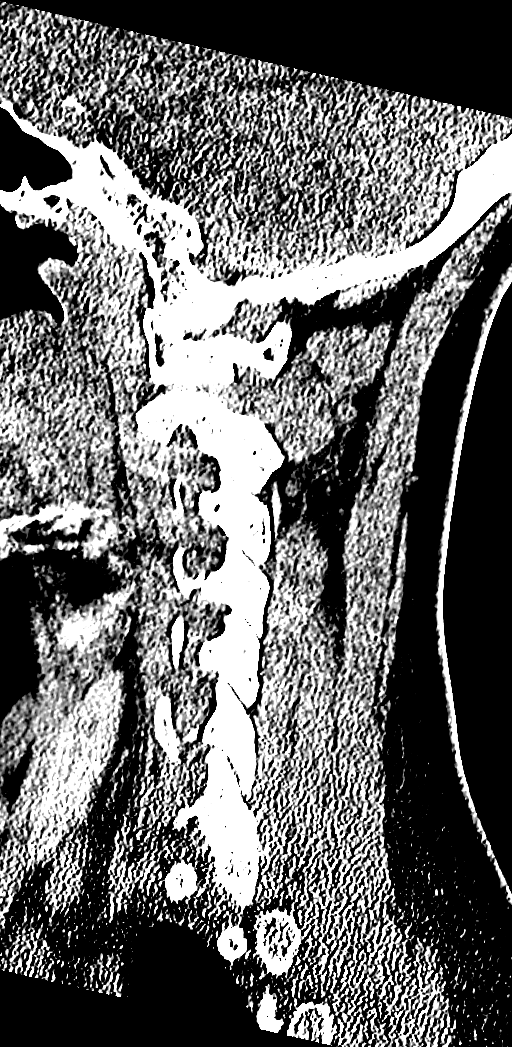

[Series 8: coronal bone 2.0 · coronal · 0.31mm/px · 3 of 55 slices shown]
[im 19/55  brain]
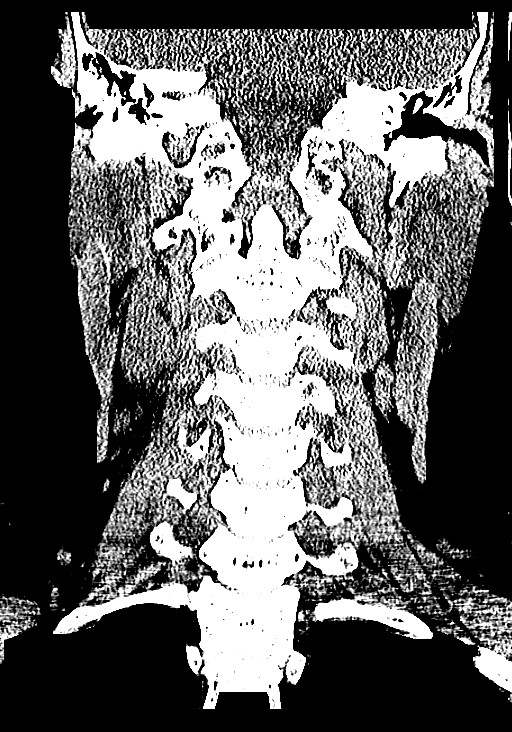
[im 25/55  brain]
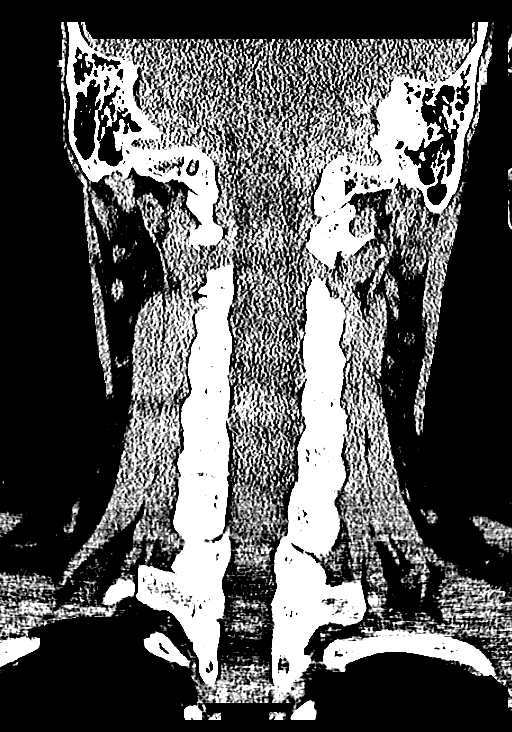
[im 31/55  brain]
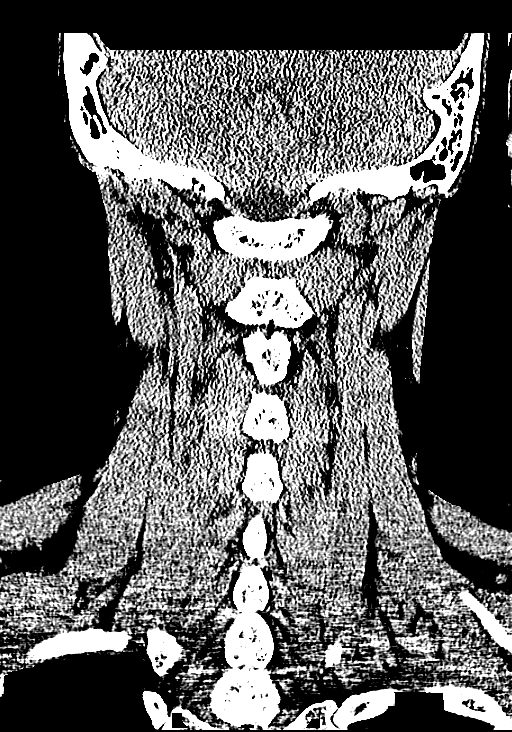

[Series 9: axial bone 2.0 · axial · 0.22mm/px · z∈[+86,+221]mm · 6 of 117 slices shown]
[im 9/117  bone]
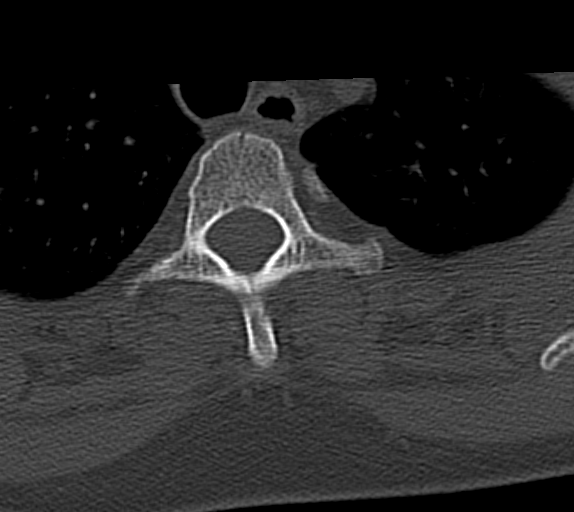
[im 27/117  bone]
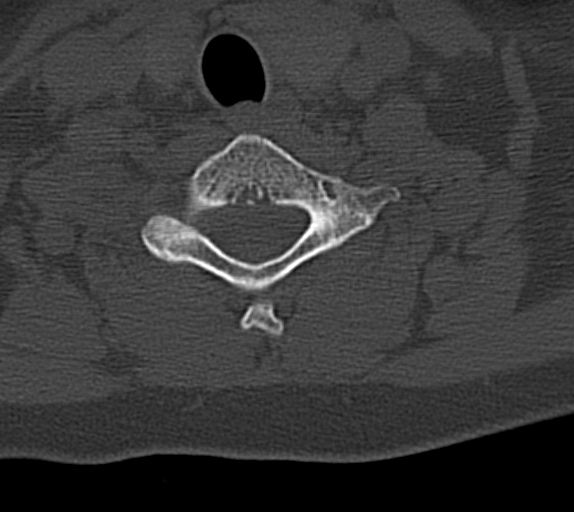
[im 36/117  bone]
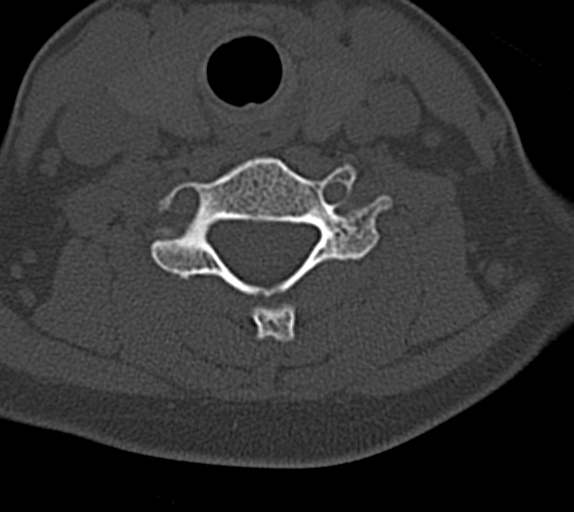
[im 54/117  bone]
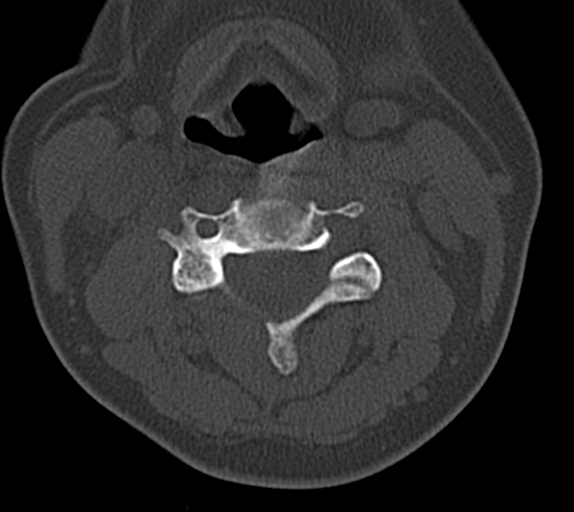
[im 63/117  bone]
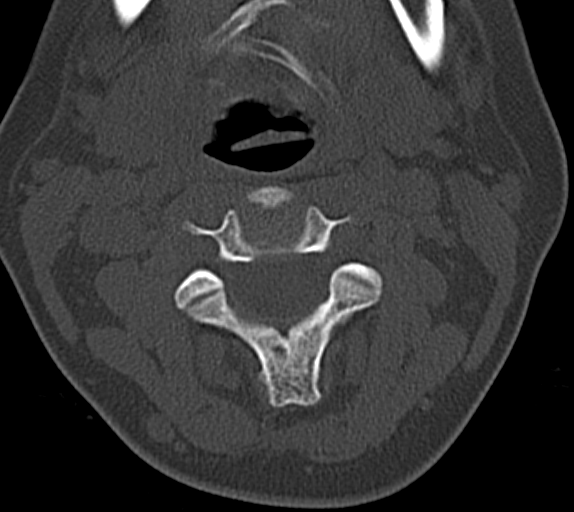
[im 81/117  bone]
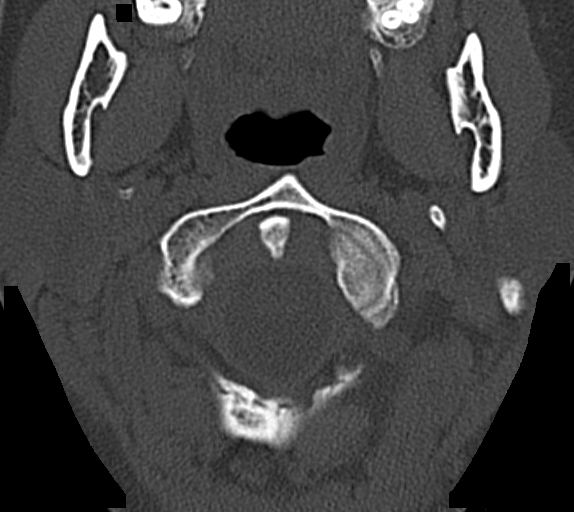

[14 of 47 positions shown; findings below may reference images not displayed]

FINDINGS: CT HEAD FINDINGS

No acute intracranial abnormality. Specifically, no hemorrhage,
hydrocephalus, mass lesion, acute infarction, or significant
intracranial injury. No acute calvarial abnormality. Visualized
paranasal sinuses and mastoids clear. Orbital soft tissues
unremarkable.

CT CERVICAL SPINE FINDINGS

Normal alignment. Loss of normal cervical lordosis. Prevertebral
soft tissues are normal. Disc spaces are maintained. No neural
foraminal narrowing. No fracture. No epidural or paraspinal
hematoma.
IMPRESSION: No intracranial abnormality.

Mild cervical straightening.  Otherwise unremarkable C-spine CT.

## 2018-08-24 DIAGNOSIS — J029 Acute pharyngitis, unspecified: Secondary | ICD-10-CM | POA: Diagnosis not present

## 2019-12-16 DIAGNOSIS — R21 Rash and other nonspecific skin eruption: Secondary | ICD-10-CM | POA: Diagnosis not present

## 2019-12-16 DIAGNOSIS — L089 Local infection of the skin and subcutaneous tissue, unspecified: Secondary | ICD-10-CM | POA: Diagnosis not present

## 2019-12-16 DIAGNOSIS — R58 Hemorrhage, not elsewhere classified: Secondary | ICD-10-CM | POA: Diagnosis not present

## 2020-07-12 DIAGNOSIS — M25561 Pain in right knee: Secondary | ICD-10-CM | POA: Diagnosis not present

## 2020-07-12 DIAGNOSIS — W01198A Fall on same level from slipping, tripping and stumbling with subsequent striking against other object, initial encounter: Secondary | ICD-10-CM | POA: Diagnosis not present

## 2020-07-12 DIAGNOSIS — M7989 Other specified soft tissue disorders: Secondary | ICD-10-CM | POA: Diagnosis not present

## 2020-07-13 ENCOUNTER — Telehealth: Payer: Self-pay

## 2020-07-13 NOTE — Telephone Encounter (Signed)
Transition Care Management Unsuccessful Follow-up Telephone Call  Date of discharge and from where:  07/12/2020 [facility] ED  Attempts:  1st Attempt  Reason for unsuccessful TCM follow-up call:  No answer/busy

## 2020-07-14 NOTE — Telephone Encounter (Signed)
Transition Care Management Unsuccessful Follow-up Telephone Call  Date of discharge and from where:  07/12/2020 [facility] ED  Attempts:  2nd Attempt  Reason for unsuccessful TCM follow-up call:  No answer/busy

## 2020-07-15 NOTE — Telephone Encounter (Signed)
Transition Care Management Unsuccessful Follow-up Telephone Call  Date of discharge and from where:  07/12/2020 [facility] ED  Attempts:  3rd Attempt  Reason for unsuccessful TCM follow-up call:  Unable to leave message

## 2021-10-26 DIAGNOSIS — K130 Diseases of lips: Secondary | ICD-10-CM | POA: Diagnosis not present

## 2021-10-27 ENCOUNTER — Telehealth: Payer: Self-pay

## 2021-10-27 NOTE — Telephone Encounter (Signed)
Transition Care Management Unsuccessful Follow-up Telephone Call ? ?Date of discharge and from where:  10/27/2021 from [facility] ? ?Attempts:  1st Attempt ? ?Reason for unsuccessful TCM follow-up call:  Left voice message ? ? ? ?

## 2021-10-28 NOTE — Telephone Encounter (Signed)
Transition Care Management Unsuccessful Follow-up Telephone Call ? ?Date of discharge and from where:  10/27/2021 from [facility] ? ?Attempts:  2nd Attempt ? ?Reason for unsuccessful TCM follow-up call:  Left voice message ? ? ? ? ?

## 2021-10-29 NOTE — Telephone Encounter (Signed)
Transition Care Management Unsuccessful Follow-up Telephone Call ? ?Date of discharge and from where:  10/26/2021 from [facility] ? ?Attempts:  3rd Attempt ? ?Reason for unsuccessful TCM follow-up call:  Unable to reach patient ? ? ? ?

## 2022-11-23 ENCOUNTER — Other Ambulatory Visit (HOSPITAL_COMMUNITY): Payer: Self-pay | Admitting: *Deleted

## 2022-11-23 DIAGNOSIS — Z1231 Encounter for screening mammogram for malignant neoplasm of breast: Secondary | ICD-10-CM

## 2022-12-08 ENCOUNTER — Ambulatory Visit (HOSPITAL_COMMUNITY)
Admission: RE | Admit: 2022-12-08 | Discharge: 2022-12-08 | Disposition: A | Discharge status: Home / Self Care | Payer: Medicaid Other | Source: Ambulatory Visit | Attending: *Deleted | Admitting: *Deleted

## 2022-12-08 DIAGNOSIS — Z1231 Encounter for screening mammogram for malignant neoplasm of breast: Secondary | ICD-10-CM | POA: Diagnosis not present

## 2022-12-14 ENCOUNTER — Other Ambulatory Visit (HOSPITAL_COMMUNITY): Payer: Self-pay | Admitting: *Deleted

## 2022-12-14 DIAGNOSIS — R928 Other abnormal and inconclusive findings on diagnostic imaging of breast: Secondary | ICD-10-CM

## 2022-12-20 ENCOUNTER — Ambulatory Visit (HOSPITAL_COMMUNITY)
Admission: RE | Admit: 2022-12-20 | Discharge: 2022-12-20 | Disposition: A | Discharge status: Home / Self Care | Payer: Medicaid Other | Source: Ambulatory Visit | Attending: *Deleted | Admitting: *Deleted

## 2022-12-20 ENCOUNTER — Encounter (HOSPITAL_COMMUNITY): Payer: Self-pay

## 2022-12-20 DIAGNOSIS — R928 Other abnormal and inconclusive findings on diagnostic imaging of breast: Secondary | ICD-10-CM | POA: Insufficient documentation

## 2023-06-27 ENCOUNTER — Other Ambulatory Visit (HOSPITAL_COMMUNITY): Payer: Self-pay | Admitting: *Deleted

## 2023-06-27 ENCOUNTER — Encounter (HOSPITAL_COMMUNITY): Payer: Self-pay | Admitting: *Deleted

## 2023-06-27 DIAGNOSIS — N6489 Other specified disorders of breast: Secondary | ICD-10-CM

## 2023-07-04 ENCOUNTER — Ambulatory Visit (HOSPITAL_COMMUNITY)
Admission: RE | Admit: 2023-07-04 | Discharge: 2023-07-04 | Disposition: A | Discharge status: Home / Self Care | Payer: Medicaid Other | Source: Ambulatory Visit | Attending: *Deleted | Admitting: *Deleted

## 2023-07-04 ENCOUNTER — Encounter (HOSPITAL_COMMUNITY): Payer: Self-pay

## 2023-07-04 DIAGNOSIS — N6489 Other specified disorders of breast: Secondary | ICD-10-CM | POA: Insufficient documentation
